# Patient Record
Sex: Male | Born: 1960 | Race: Black or African American | Hispanic: No | Marital: Single | Smoking: Former smoker
Health system: Southern US, Community
[De-identification: ages and names within clinical notes are randomized; demographics above are authoritative.]

## PROBLEM LIST (undated history)

## (undated) DIAGNOSIS — E079 Disorder of thyroid, unspecified: Secondary | ICD-10-CM

## (undated) DIAGNOSIS — N289 Disorder of kidney and ureter, unspecified: Secondary | ICD-10-CM

## (undated) DIAGNOSIS — M109 Gout, unspecified: Secondary | ICD-10-CM

## (undated) DIAGNOSIS — I10 Essential (primary) hypertension: Secondary | ICD-10-CM

## (undated) HISTORY — PX: HERNIA REPAIR: SHX51

---

## 2015-11-03 DIAGNOSIS — E785 Hyperlipidemia, unspecified: Secondary | ICD-10-CM | POA: Diagnosis not present

## 2015-11-03 DIAGNOSIS — I129 Hypertensive chronic kidney disease with stage 1 through stage 4 chronic kidney disease, or unspecified chronic kidney disease: Secondary | ICD-10-CM | POA: Diagnosis not present

## 2015-11-03 DIAGNOSIS — R809 Proteinuria, unspecified: Secondary | ICD-10-CM | POA: Diagnosis not present

## 2015-11-03 DIAGNOSIS — N179 Acute kidney failure, unspecified: Secondary | ICD-10-CM | POA: Diagnosis not present

## 2015-11-03 DIAGNOSIS — E039 Hypothyroidism, unspecified: Secondary | ICD-10-CM | POA: Diagnosis not present

## 2015-11-03 DIAGNOSIS — E790 Hyperuricemia without signs of inflammatory arthritis and tophaceous disease: Secondary | ICD-10-CM | POA: Diagnosis not present

## 2015-11-03 DIAGNOSIS — Z886 Allergy status to analgesic agent status: Secondary | ICD-10-CM | POA: Diagnosis not present

## 2015-11-03 DIAGNOSIS — N183 Chronic kidney disease, stage 3 (moderate): Secondary | ICD-10-CM | POA: Diagnosis not present

## 2015-11-03 DIAGNOSIS — Z79899 Other long term (current) drug therapy: Secondary | ICD-10-CM | POA: Diagnosis not present

## 2015-11-03 DIAGNOSIS — Z888 Allergy status to other drugs, medicaments and biological substances status: Secondary | ICD-10-CM | POA: Diagnosis not present

## 2015-12-08 DIAGNOSIS — I129 Hypertensive chronic kidney disease with stage 1 through stage 4 chronic kidney disease, or unspecified chronic kidney disease: Secondary | ICD-10-CM | POA: Diagnosis not present

## 2015-12-08 DIAGNOSIS — E559 Vitamin D deficiency, unspecified: Secondary | ICD-10-CM | POA: Diagnosis not present

## 2015-12-08 DIAGNOSIS — Z888 Allergy status to other drugs, medicaments and biological substances status: Secondary | ICD-10-CM | POA: Diagnosis not present

## 2015-12-08 DIAGNOSIS — R809 Proteinuria, unspecified: Secondary | ICD-10-CM | POA: Diagnosis not present

## 2015-12-08 DIAGNOSIS — N183 Chronic kidney disease, stage 3 (moderate): Secondary | ICD-10-CM | POA: Diagnosis not present

## 2015-12-08 DIAGNOSIS — N2581 Secondary hyperparathyroidism of renal origin: Secondary | ICD-10-CM | POA: Diagnosis not present

## 2015-12-08 DIAGNOSIS — E790 Hyperuricemia without signs of inflammatory arthritis and tophaceous disease: Secondary | ICD-10-CM | POA: Diagnosis not present

## 2015-12-08 DIAGNOSIS — E039 Hypothyroidism, unspecified: Secondary | ICD-10-CM | POA: Diagnosis not present

## 2015-12-08 DIAGNOSIS — Z79899 Other long term (current) drug therapy: Secondary | ICD-10-CM | POA: Diagnosis not present

## 2015-12-08 DIAGNOSIS — Z885 Allergy status to narcotic agent status: Secondary | ICD-10-CM | POA: Diagnosis not present

## 2015-12-08 DIAGNOSIS — E785 Hyperlipidemia, unspecified: Secondary | ICD-10-CM | POA: Diagnosis not present

## 2015-12-08 DIAGNOSIS — N179 Acute kidney failure, unspecified: Secondary | ICD-10-CM | POA: Diagnosis not present

## 2015-12-11 DIAGNOSIS — E785 Hyperlipidemia, unspecified: Secondary | ICD-10-CM | POA: Diagnosis not present

## 2015-12-11 DIAGNOSIS — K439 Ventral hernia without obstruction or gangrene: Secondary | ICD-10-CM | POA: Diagnosis not present

## 2015-12-11 DIAGNOSIS — N2581 Secondary hyperparathyroidism of renal origin: Secondary | ICD-10-CM | POA: Diagnosis not present

## 2015-12-11 DIAGNOSIS — Z23 Encounter for immunization: Secondary | ICD-10-CM | POA: Diagnosis not present

## 2015-12-11 DIAGNOSIS — E890 Postprocedural hypothyroidism: Secondary | ICD-10-CM | POA: Diagnosis not present

## 2015-12-11 DIAGNOSIS — I129 Hypertensive chronic kidney disease with stage 1 through stage 4 chronic kidney disease, or unspecified chronic kidney disease: Secondary | ICD-10-CM | POA: Diagnosis not present

## 2015-12-11 DIAGNOSIS — Z888 Allergy status to other drugs, medicaments and biological substances status: Secondary | ICD-10-CM | POA: Diagnosis not present

## 2015-12-11 DIAGNOSIS — Z79899 Other long term (current) drug therapy: Secondary | ICD-10-CM | POA: Diagnosis not present

## 2015-12-11 DIAGNOSIS — N183 Chronic kidney disease, stage 3 (moderate): Secondary | ICD-10-CM | POA: Diagnosis not present

## 2015-12-11 DIAGNOSIS — R809 Proteinuria, unspecified: Secondary | ICD-10-CM | POA: Diagnosis not present

## 2015-12-11 DIAGNOSIS — N042 Nephrotic syndrome with diffuse membranous glomerulonephritis: Secondary | ICD-10-CM | POA: Diagnosis not present

## 2016-02-09 DIAGNOSIS — I129 Hypertensive chronic kidney disease with stage 1 through stage 4 chronic kidney disease, or unspecified chronic kidney disease: Secondary | ICD-10-CM | POA: Diagnosis not present

## 2016-02-09 DIAGNOSIS — E039 Hypothyroidism, unspecified: Secondary | ICD-10-CM | POA: Diagnosis not present

## 2016-02-09 DIAGNOSIS — M79672 Pain in left foot: Secondary | ICD-10-CM | POA: Diagnosis not present

## 2016-02-09 DIAGNOSIS — Z87891 Personal history of nicotine dependence: Secondary | ICD-10-CM | POA: Diagnosis not present

## 2016-02-09 DIAGNOSIS — M7732 Calcaneal spur, left foot: Secondary | ICD-10-CM | POA: Diagnosis not present

## 2016-02-09 DIAGNOSIS — M25471 Effusion, right ankle: Secondary | ICD-10-CM | POA: Diagnosis not present

## 2016-02-09 DIAGNOSIS — M85872 Other specified disorders of bone density and structure, left ankle and foot: Secondary | ICD-10-CM | POA: Diagnosis not present

## 2016-02-09 DIAGNOSIS — E785 Hyperlipidemia, unspecified: Secondary | ICD-10-CM | POA: Diagnosis not present

## 2016-02-09 DIAGNOSIS — L03116 Cellulitis of left lower limb: Secondary | ICD-10-CM | POA: Diagnosis not present

## 2016-02-09 DIAGNOSIS — N183 Chronic kidney disease, stage 3 (moderate): Secondary | ICD-10-CM | POA: Diagnosis not present

## 2016-02-10 DIAGNOSIS — E785 Hyperlipidemia, unspecified: Secondary | ICD-10-CM | POA: Diagnosis not present

## 2016-02-10 DIAGNOSIS — Z886 Allergy status to analgesic agent status: Secondary | ICD-10-CM | POA: Diagnosis not present

## 2016-02-10 DIAGNOSIS — E0500 Thyrotoxicosis with diffuse goiter without thyrotoxic crisis or storm: Secondary | ICD-10-CM | POA: Diagnosis not present

## 2016-02-10 DIAGNOSIS — Z79899 Other long term (current) drug therapy: Secondary | ICD-10-CM | POA: Diagnosis not present

## 2016-02-10 DIAGNOSIS — I10 Essential (primary) hypertension: Secondary | ICD-10-CM | POA: Diagnosis not present

## 2016-02-10 DIAGNOSIS — M109 Gout, unspecified: Secondary | ICD-10-CM | POA: Diagnosis not present

## 2016-02-10 DIAGNOSIS — Z888 Allergy status to other drugs, medicaments and biological substances status: Secondary | ICD-10-CM | POA: Diagnosis not present

## 2016-02-10 DIAGNOSIS — Z87891 Personal history of nicotine dependence: Secondary | ICD-10-CM | POA: Diagnosis not present

## 2016-02-10 DIAGNOSIS — L03116 Cellulitis of left lower limb: Secondary | ICD-10-CM | POA: Diagnosis not present

## 2016-02-10 DIAGNOSIS — R600 Localized edema: Secondary | ICD-10-CM | POA: Diagnosis not present

## 2016-02-10 DIAGNOSIS — N183 Chronic kidney disease, stage 3 (moderate): Secondary | ICD-10-CM | POA: Diagnosis not present

## 2016-02-10 DIAGNOSIS — I517 Cardiomegaly: Secondary | ICD-10-CM | POA: Diagnosis not present

## 2016-02-10 DIAGNOSIS — Z5189 Encounter for other specified aftercare: Secondary | ICD-10-CM | POA: Diagnosis not present

## 2016-02-10 DIAGNOSIS — I129 Hypertensive chronic kidney disease with stage 1 through stage 4 chronic kidney disease, or unspecified chronic kidney disease: Secondary | ICD-10-CM | POA: Diagnosis not present

## 2016-03-08 DIAGNOSIS — E559 Vitamin D deficiency, unspecified: Secondary | ICD-10-CM | POA: Diagnosis not present

## 2016-03-08 DIAGNOSIS — Z79899 Other long term (current) drug therapy: Secondary | ICD-10-CM | POA: Diagnosis not present

## 2016-03-08 DIAGNOSIS — N183 Chronic kidney disease, stage 3 (moderate): Secondary | ICD-10-CM | POA: Diagnosis not present

## 2016-03-08 DIAGNOSIS — Z886 Allergy status to analgesic agent status: Secondary | ICD-10-CM | POA: Diagnosis not present

## 2016-03-08 DIAGNOSIS — E785 Hyperlipidemia, unspecified: Secondary | ICD-10-CM | POA: Diagnosis not present

## 2016-03-08 DIAGNOSIS — Z888 Allergy status to other drugs, medicaments and biological substances status: Secondary | ICD-10-CM | POA: Diagnosis not present

## 2016-03-08 DIAGNOSIS — E790 Hyperuricemia without signs of inflammatory arthritis and tophaceous disease: Secondary | ICD-10-CM | POA: Diagnosis not present

## 2016-03-08 DIAGNOSIS — R809 Proteinuria, unspecified: Secondary | ICD-10-CM | POA: Diagnosis not present

## 2016-03-08 DIAGNOSIS — E039 Hypothyroidism, unspecified: Secondary | ICD-10-CM | POA: Diagnosis not present

## 2016-03-08 DIAGNOSIS — N2581 Secondary hyperparathyroidism of renal origin: Secondary | ICD-10-CM | POA: Diagnosis not present

## 2016-03-08 DIAGNOSIS — I129 Hypertensive chronic kidney disease with stage 1 through stage 4 chronic kidney disease, or unspecified chronic kidney disease: Secondary | ICD-10-CM | POA: Diagnosis not present

## 2016-03-08 DIAGNOSIS — I1310 Hypertensive heart and chronic kidney disease without heart failure, with stage 1 through stage 4 chronic kidney disease, or unspecified chronic kidney disease: Secondary | ICD-10-CM | POA: Diagnosis not present

## 2016-04-01 DIAGNOSIS — E0500 Thyrotoxicosis with diffuse goiter without thyrotoxic crisis or storm: Secondary | ICD-10-CM | POA: Diagnosis not present

## 2016-04-01 DIAGNOSIS — E790 Hyperuricemia without signs of inflammatory arthritis and tophaceous disease: Secondary | ICD-10-CM | POA: Diagnosis not present

## 2016-04-01 DIAGNOSIS — K432 Incisional hernia without obstruction or gangrene: Secondary | ICD-10-CM | POA: Diagnosis not present

## 2016-04-01 DIAGNOSIS — Z79899 Other long term (current) drug therapy: Secondary | ICD-10-CM | POA: Diagnosis not present

## 2016-04-01 DIAGNOSIS — E039 Hypothyroidism, unspecified: Secondary | ICD-10-CM | POA: Diagnosis not present

## 2016-04-01 DIAGNOSIS — N183 Chronic kidney disease, stage 3 (moderate): Secondary | ICD-10-CM | POA: Diagnosis not present

## 2016-04-01 DIAGNOSIS — Z23 Encounter for immunization: Secondary | ICD-10-CM | POA: Diagnosis not present

## 2016-04-01 DIAGNOSIS — I129 Hypertensive chronic kidney disease with stage 1 through stage 4 chronic kidney disease, or unspecified chronic kidney disease: Secondary | ICD-10-CM | POA: Diagnosis not present

## 2016-04-01 DIAGNOSIS — N049 Nephrotic syndrome with unspecified morphologic changes: Secondary | ICD-10-CM | POA: Diagnosis not present

## 2016-05-08 ENCOUNTER — Emergency Department (HOSPITAL_BASED_OUTPATIENT_CLINIC_OR_DEPARTMENT_OTHER)
Admission: EM | Admit: 2016-05-08 | Discharge: 2016-05-08 | Disposition: A | Discharge status: Home / Self Care | Payer: Self-pay | Attending: Emergency Medicine | Admitting: Emergency Medicine

## 2016-05-08 ENCOUNTER — Emergency Department (HOSPITAL_BASED_OUTPATIENT_CLINIC_OR_DEPARTMENT_OTHER): Payer: Self-pay

## 2016-05-08 ENCOUNTER — Encounter (HOSPITAL_BASED_OUTPATIENT_CLINIC_OR_DEPARTMENT_OTHER): Payer: Self-pay | Admitting: *Deleted

## 2016-05-08 DIAGNOSIS — I10 Essential (primary) hypertension: Secondary | ICD-10-CM | POA: Insufficient documentation

## 2016-05-08 DIAGNOSIS — Y999 Unspecified external cause status: Secondary | ICD-10-CM | POA: Insufficient documentation

## 2016-05-08 DIAGNOSIS — Z87891 Personal history of nicotine dependence: Secondary | ICD-10-CM | POA: Insufficient documentation

## 2016-05-08 DIAGNOSIS — R55 Syncope and collapse: Secondary | ICD-10-CM | POA: Insufficient documentation

## 2016-05-08 DIAGNOSIS — Y92411 Interstate highway as the place of occurrence of the external cause: Secondary | ICD-10-CM | POA: Insufficient documentation

## 2016-05-08 DIAGNOSIS — S39012A Strain of muscle, fascia and tendon of lower back, initial encounter: Secondary | ICD-10-CM | POA: Insufficient documentation

## 2016-05-08 DIAGNOSIS — S60212A Contusion of left wrist, initial encounter: Secondary | ICD-10-CM | POA: Insufficient documentation

## 2016-05-08 DIAGNOSIS — Y9389 Activity, other specified: Secondary | ICD-10-CM | POA: Insufficient documentation

## 2016-05-08 DIAGNOSIS — Z79899 Other long term (current) drug therapy: Secondary | ICD-10-CM | POA: Insufficient documentation

## 2016-05-08 DIAGNOSIS — S8012XA Contusion of left lower leg, initial encounter: Secondary | ICD-10-CM | POA: Insufficient documentation

## 2016-05-08 HISTORY — DX: Disorder of kidney and ureter, unspecified: N28.9

## 2016-05-08 HISTORY — DX: Essential (primary) hypertension: I10

## 2016-05-08 HISTORY — DX: Disorder of thyroid, unspecified: E07.9
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
## 2016-06-07 DIAGNOSIS — I129 Hypertensive chronic kidney disease with stage 1 through stage 4 chronic kidney disease, or unspecified chronic kidney disease: Secondary | ICD-10-CM | POA: Diagnosis not present

## 2016-06-07 DIAGNOSIS — N059 Unspecified nephritic syndrome with unspecified morphologic changes: Secondary | ICD-10-CM | POA: Diagnosis not present

## 2016-06-07 DIAGNOSIS — E559 Vitamin D deficiency, unspecified: Secondary | ICD-10-CM | POA: Diagnosis not present

## 2016-06-07 DIAGNOSIS — E790 Hyperuricemia without signs of inflammatory arthritis and tophaceous disease: Secondary | ICD-10-CM | POA: Diagnosis not present

## 2016-06-07 DIAGNOSIS — R801 Persistent proteinuria, unspecified: Secondary | ICD-10-CM | POA: Diagnosis not present

## 2016-06-07 DIAGNOSIS — N183 Chronic kidney disease, stage 3 (moderate): Secondary | ICD-10-CM | POA: Diagnosis not present

## 2016-06-07 DIAGNOSIS — N2581 Secondary hyperparathyroidism of renal origin: Secondary | ICD-10-CM | POA: Diagnosis not present

## 2016-06-07 DIAGNOSIS — Z79899 Other long term (current) drug therapy: Secondary | ICD-10-CM | POA: Diagnosis not present

## 2016-06-07 DIAGNOSIS — Z7982 Long term (current) use of aspirin: Secondary | ICD-10-CM | POA: Diagnosis not present

## 2016-06-07 DIAGNOSIS — R809 Proteinuria, unspecified: Secondary | ICD-10-CM | POA: Diagnosis not present

## 2016-06-07 DIAGNOSIS — E785 Hyperlipidemia, unspecified: Secondary | ICD-10-CM | POA: Diagnosis not present

## 2016-06-07 DIAGNOSIS — Z888 Allergy status to other drugs, medicaments and biological substances status: Secondary | ICD-10-CM | POA: Diagnosis not present

## 2016-06-07 DIAGNOSIS — M109 Gout, unspecified: Secondary | ICD-10-CM | POA: Diagnosis not present

## 2016-06-07 DIAGNOSIS — E039 Hypothyroidism, unspecified: Secondary | ICD-10-CM | POA: Diagnosis not present

## 2016-09-07 DIAGNOSIS — E039 Hypothyroidism, unspecified: Secondary | ICD-10-CM | POA: Diagnosis not present

## 2016-09-07 DIAGNOSIS — E785 Hyperlipidemia, unspecified: Secondary | ICD-10-CM | POA: Diagnosis not present

## 2016-09-07 DIAGNOSIS — Z79899 Other long term (current) drug therapy: Secondary | ICD-10-CM | POA: Diagnosis not present

## 2016-09-07 DIAGNOSIS — N183 Chronic kidney disease, stage 3 (moderate): Secondary | ICD-10-CM | POA: Diagnosis not present

## 2016-09-07 DIAGNOSIS — N2581 Secondary hyperparathyroidism of renal origin: Secondary | ICD-10-CM | POA: Diagnosis not present

## 2016-09-07 DIAGNOSIS — Z87891 Personal history of nicotine dependence: Secondary | ICD-10-CM | POA: Diagnosis not present

## 2016-09-07 DIAGNOSIS — I129 Hypertensive chronic kidney disease with stage 1 through stage 4 chronic kidney disease, or unspecified chronic kidney disease: Secondary | ICD-10-CM | POA: Diagnosis not present

## 2016-09-07 DIAGNOSIS — R801 Persistent proteinuria, unspecified: Secondary | ICD-10-CM | POA: Diagnosis not present

## 2016-09-07 DIAGNOSIS — M109 Gout, unspecified: Secondary | ICD-10-CM | POA: Diagnosis not present

## 2016-09-07 DIAGNOSIS — Z888 Allergy status to other drugs, medicaments and biological substances status: Secondary | ICD-10-CM | POA: Diagnosis not present

## 2016-09-07 DIAGNOSIS — R809 Proteinuria, unspecified: Secondary | ICD-10-CM | POA: Diagnosis not present

## 2016-09-07 DIAGNOSIS — Z7982 Long term (current) use of aspirin: Secondary | ICD-10-CM | POA: Diagnosis not present

## 2016-09-07 DIAGNOSIS — E559 Vitamin D deficiency, unspecified: Secondary | ICD-10-CM | POA: Diagnosis not present

## 2016-11-04 DIAGNOSIS — Z79899 Other long term (current) drug therapy: Secondary | ICD-10-CM | POA: Diagnosis not present

## 2016-11-04 DIAGNOSIS — K439 Ventral hernia without obstruction or gangrene: Secondary | ICD-10-CM | POA: Diagnosis not present

## 2016-11-04 DIAGNOSIS — I129 Hypertensive chronic kidney disease with stage 1 through stage 4 chronic kidney disease, or unspecified chronic kidney disease: Secondary | ICD-10-CM | POA: Diagnosis not present

## 2016-11-04 DIAGNOSIS — Z125 Encounter for screening for malignant neoplasm of prostate: Secondary | ICD-10-CM | POA: Diagnosis not present

## 2016-11-04 DIAGNOSIS — Z23 Encounter for immunization: Secondary | ICD-10-CM | POA: Diagnosis not present

## 2016-11-04 DIAGNOSIS — N042 Nephrotic syndrome with diffuse membranous glomerulonephritis: Secondary | ICD-10-CM | POA: Diagnosis not present

## 2016-11-04 DIAGNOSIS — Z7982 Long term (current) use of aspirin: Secondary | ICD-10-CM | POA: Diagnosis not present

## 2016-11-04 DIAGNOSIS — Z0000 Encounter for general adult medical examination without abnormal findings: Secondary | ICD-10-CM | POA: Diagnosis not present

## 2016-11-04 DIAGNOSIS — R809 Proteinuria, unspecified: Secondary | ICD-10-CM | POA: Diagnosis not present

## 2016-11-04 DIAGNOSIS — N2581 Secondary hyperparathyroidism of renal origin: Secondary | ICD-10-CM | POA: Diagnosis not present

## 2016-11-04 DIAGNOSIS — Z888 Allergy status to other drugs, medicaments and biological substances status: Secondary | ICD-10-CM | POA: Diagnosis not present

## 2016-11-04 DIAGNOSIS — E0500 Thyrotoxicosis with diffuse goiter without thyrotoxic crisis or storm: Secondary | ICD-10-CM | POA: Diagnosis not present

## 2016-11-04 DIAGNOSIS — N183 Chronic kidney disease, stage 3 (moderate): Secondary | ICD-10-CM | POA: Diagnosis not present

## 2016-11-04 DIAGNOSIS — Z9889 Other specified postprocedural states: Secondary | ICD-10-CM | POA: Diagnosis not present

## 2016-11-04 DIAGNOSIS — E039 Hypothyroidism, unspecified: Secondary | ICD-10-CM | POA: Diagnosis not present

## 2016-12-14 DIAGNOSIS — R809 Proteinuria, unspecified: Secondary | ICD-10-CM | POA: Diagnosis not present

## 2016-12-14 DIAGNOSIS — E039 Hypothyroidism, unspecified: Secondary | ICD-10-CM | POA: Diagnosis not present

## 2016-12-14 DIAGNOSIS — M109 Gout, unspecified: Secondary | ICD-10-CM | POA: Diagnosis not present

## 2016-12-14 DIAGNOSIS — R801 Persistent proteinuria, unspecified: Secondary | ICD-10-CM | POA: Diagnosis not present

## 2016-12-14 DIAGNOSIS — I129 Hypertensive chronic kidney disease with stage 1 through stage 4 chronic kidney disease, or unspecified chronic kidney disease: Secondary | ICD-10-CM | POA: Diagnosis not present

## 2016-12-14 DIAGNOSIS — N183 Chronic kidney disease, stage 3 (moderate): Secondary | ICD-10-CM | POA: Diagnosis not present

## 2016-12-14 DIAGNOSIS — I10 Essential (primary) hypertension: Secondary | ICD-10-CM | POA: Diagnosis not present

## 2016-12-14 DIAGNOSIS — E785 Hyperlipidemia, unspecified: Secondary | ICD-10-CM | POA: Diagnosis not present

## 2016-12-14 DIAGNOSIS — Z1159 Encounter for screening for other viral diseases: Secondary | ICD-10-CM | POA: Diagnosis not present

## 2016-12-14 DIAGNOSIS — N2581 Secondary hyperparathyroidism of renal origin: Secondary | ICD-10-CM | POA: Diagnosis not present

## 2017-02-10 DIAGNOSIS — I129 Hypertensive chronic kidney disease with stage 1 through stage 4 chronic kidney disease, or unspecified chronic kidney disease: Secondary | ICD-10-CM | POA: Diagnosis not present

## 2017-02-10 DIAGNOSIS — N183 Chronic kidney disease, stage 3 (moderate): Secondary | ICD-10-CM | POA: Diagnosis not present

## 2017-02-10 DIAGNOSIS — E790 Hyperuricemia without signs of inflammatory arthritis and tophaceous disease: Secondary | ICD-10-CM | POA: Diagnosis not present

## 2017-02-10 DIAGNOSIS — N2581 Secondary hyperparathyroidism of renal origin: Secondary | ICD-10-CM | POA: Diagnosis not present

## 2017-02-10 DIAGNOSIS — E559 Vitamin D deficiency, unspecified: Secondary | ICD-10-CM | POA: Diagnosis not present

## 2017-02-10 DIAGNOSIS — N052 Unspecified nephritic syndrome with diffuse membranous glomerulonephritis: Secondary | ICD-10-CM | POA: Diagnosis not present

## 2017-02-10 DIAGNOSIS — Z7951 Long term (current) use of inhaled steroids: Secondary | ICD-10-CM | POA: Diagnosis not present

## 2017-02-10 DIAGNOSIS — Z7982 Long term (current) use of aspirin: Secondary | ICD-10-CM | POA: Diagnosis not present

## 2017-03-16 DIAGNOSIS — E785 Hyperlipidemia, unspecified: Secondary | ICD-10-CM | POA: Diagnosis not present

## 2017-03-16 DIAGNOSIS — R809 Proteinuria, unspecified: Secondary | ICD-10-CM | POA: Diagnosis not present

## 2017-03-16 DIAGNOSIS — N2581 Secondary hyperparathyroidism of renal origin: Secondary | ICD-10-CM | POA: Diagnosis not present

## 2017-03-16 DIAGNOSIS — R801 Persistent proteinuria, unspecified: Secondary | ICD-10-CM | POA: Diagnosis not present

## 2017-03-16 DIAGNOSIS — N184 Chronic kidney disease, stage 4 (severe): Secondary | ICD-10-CM | POA: Diagnosis not present

## 2017-03-16 DIAGNOSIS — M109 Gout, unspecified: Secondary | ICD-10-CM | POA: Diagnosis not present

## 2017-03-16 DIAGNOSIS — I129 Hypertensive chronic kidney disease with stage 1 through stage 4 chronic kidney disease, or unspecified chronic kidney disease: Secondary | ICD-10-CM | POA: Diagnosis not present

## 2017-03-16 DIAGNOSIS — N183 Chronic kidney disease, stage 3 (moderate): Secondary | ICD-10-CM | POA: Diagnosis not present

## 2017-03-16 DIAGNOSIS — E039 Hypothyroidism, unspecified: Secondary | ICD-10-CM | POA: Diagnosis not present

## 2017-04-04 DIAGNOSIS — M109 Gout, unspecified: Secondary | ICD-10-CM | POA: Diagnosis not present

## 2017-04-21 DIAGNOSIS — M1A30X0 Chronic gout due to renal impairment, unspecified site, without tophus (tophi): Secondary | ICD-10-CM | POA: Diagnosis not present

## 2017-04-21 DIAGNOSIS — M109 Gout, unspecified: Secondary | ICD-10-CM | POA: Diagnosis not present

## 2017-04-21 DIAGNOSIS — N183 Chronic kidney disease, stage 3 (moderate): Secondary | ICD-10-CM | POA: Diagnosis not present

## 2017-04-21 DIAGNOSIS — Z125 Encounter for screening for malignant neoplasm of prostate: Secondary | ICD-10-CM | POA: Diagnosis not present

## 2017-04-21 DIAGNOSIS — E039 Hypothyroidism, unspecified: Secondary | ICD-10-CM | POA: Diagnosis not present

## 2017-04-21 DIAGNOSIS — K439 Ventral hernia without obstruction or gangrene: Secondary | ICD-10-CM | POA: Diagnosis not present

## 2017-04-21 DIAGNOSIS — I129 Hypertensive chronic kidney disease with stage 1 through stage 4 chronic kidney disease, or unspecified chronic kidney disease: Secondary | ICD-10-CM | POA: Diagnosis not present

## 2017-04-21 DIAGNOSIS — R801 Persistent proteinuria, unspecified: Secondary | ICD-10-CM | POA: Diagnosis not present

## 2017-06-17 DIAGNOSIS — R801 Persistent proteinuria, unspecified: Secondary | ICD-10-CM | POA: Diagnosis not present

## 2017-06-17 DIAGNOSIS — N2581 Secondary hyperparathyroidism of renal origin: Secondary | ICD-10-CM | POA: Diagnosis not present

## 2017-06-17 DIAGNOSIS — Z79899 Other long term (current) drug therapy: Secondary | ICD-10-CM | POA: Diagnosis not present

## 2017-06-17 DIAGNOSIS — E785 Hyperlipidemia, unspecified: Secondary | ICD-10-CM | POA: Diagnosis not present

## 2017-06-17 DIAGNOSIS — E039 Hypothyroidism, unspecified: Secondary | ICD-10-CM | POA: Diagnosis not present

## 2017-06-17 DIAGNOSIS — E559 Vitamin D deficiency, unspecified: Secondary | ICD-10-CM | POA: Diagnosis not present

## 2017-06-17 DIAGNOSIS — N184 Chronic kidney disease, stage 4 (severe): Secondary | ICD-10-CM | POA: Diagnosis not present

## 2017-06-17 DIAGNOSIS — I129 Hypertensive chronic kidney disease with stage 1 through stage 4 chronic kidney disease, or unspecified chronic kidney disease: Secondary | ICD-10-CM | POA: Diagnosis not present

## 2017-06-17 DIAGNOSIS — M109 Gout, unspecified: Secondary | ICD-10-CM | POA: Diagnosis not present

## 2017-06-17 DIAGNOSIS — Z888 Allergy status to other drugs, medicaments and biological substances status: Secondary | ICD-10-CM | POA: Diagnosis not present

## 2017-08-01 DIAGNOSIS — K439 Ventral hernia without obstruction or gangrene: Secondary | ICD-10-CM | POA: Diagnosis not present

## 2017-08-01 DIAGNOSIS — R0609 Other forms of dyspnea: Secondary | ICD-10-CM | POA: Diagnosis not present

## 2017-08-01 DIAGNOSIS — E785 Hyperlipidemia, unspecified: Secondary | ICD-10-CM | POA: Diagnosis not present

## 2017-08-01 DIAGNOSIS — J45909 Unspecified asthma, uncomplicated: Secondary | ICD-10-CM | POA: Diagnosis not present

## 2017-08-01 DIAGNOSIS — R809 Proteinuria, unspecified: Secondary | ICD-10-CM | POA: Diagnosis not present

## 2017-08-01 DIAGNOSIS — N184 Chronic kidney disease, stage 4 (severe): Secondary | ICD-10-CM | POA: Diagnosis not present

## 2017-08-01 DIAGNOSIS — M109 Gout, unspecified: Secondary | ICD-10-CM | POA: Diagnosis not present

## 2017-08-01 DIAGNOSIS — N183 Chronic kidney disease, stage 3 (moderate): Secondary | ICD-10-CM | POA: Diagnosis not present

## 2017-08-01 DIAGNOSIS — I129 Hypertensive chronic kidney disease with stage 1 through stage 4 chronic kidney disease, or unspecified chronic kidney disease: Secondary | ICD-10-CM | POA: Diagnosis not present

## 2017-08-01 DIAGNOSIS — E890 Postprocedural hypothyroidism: Secondary | ICD-10-CM | POA: Diagnosis not present

## 2017-08-01 DIAGNOSIS — N052 Unspecified nephritic syndrome with diffuse membranous glomerulonephritis: Secondary | ICD-10-CM | POA: Diagnosis not present

## 2017-08-12 DIAGNOSIS — K760 Fatty (change of) liver, not elsewhere classified: Secondary | ICD-10-CM | POA: Diagnosis not present

## 2017-09-22 DIAGNOSIS — S6990XA Unspecified injury of unspecified wrist, hand and finger(s), initial encounter: Secondary | ICD-10-CM | POA: Diagnosis not present

## 2017-09-29 DIAGNOSIS — S6990XA Unspecified injury of unspecified wrist, hand and finger(s), initial encounter: Secondary | ICD-10-CM | POA: Diagnosis not present

## 2017-09-30 DIAGNOSIS — M79671 Pain in right foot: Secondary | ICD-10-CM | POA: Diagnosis not present

## 2017-09-30 DIAGNOSIS — L84 Corns and callosities: Secondary | ICD-10-CM | POA: Diagnosis not present

## 2017-09-30 DIAGNOSIS — M2040 Other hammer toe(s) (acquired), unspecified foot: Secondary | ICD-10-CM | POA: Diagnosis not present

## 2017-09-30 DIAGNOSIS — M79672 Pain in left foot: Secondary | ICD-10-CM | POA: Diagnosis not present

## 2017-10-12 DIAGNOSIS — I129 Hypertensive chronic kidney disease with stage 1 through stage 4 chronic kidney disease, or unspecified chronic kidney disease: Secondary | ICD-10-CM | POA: Diagnosis not present

## 2017-10-12 DIAGNOSIS — R809 Proteinuria, unspecified: Secondary | ICD-10-CM | POA: Diagnosis not present

## 2017-10-12 DIAGNOSIS — E785 Hyperlipidemia, unspecified: Secondary | ICD-10-CM | POA: Diagnosis not present

## 2017-10-12 DIAGNOSIS — Z888 Allergy status to other drugs, medicaments and biological substances status: Secondary | ICD-10-CM | POA: Diagnosis not present

## 2017-10-12 DIAGNOSIS — N2581 Secondary hyperparathyroidism of renal origin: Secondary | ICD-10-CM | POA: Diagnosis not present

## 2017-10-12 DIAGNOSIS — E039 Hypothyroidism, unspecified: Secondary | ICD-10-CM | POA: Diagnosis not present

## 2017-10-12 DIAGNOSIS — M109 Gout, unspecified: Secondary | ICD-10-CM | POA: Diagnosis not present

## 2017-10-12 DIAGNOSIS — N184 Chronic kidney disease, stage 4 (severe): Secondary | ICD-10-CM | POA: Diagnosis not present

## 2017-10-12 DIAGNOSIS — Z79899 Other long term (current) drug therapy: Secondary | ICD-10-CM | POA: Diagnosis not present

## 2017-10-12 DIAGNOSIS — R801 Persistent proteinuria, unspecified: Secondary | ICD-10-CM | POA: Diagnosis not present

## 2017-10-12 DIAGNOSIS — E559 Vitamin D deficiency, unspecified: Secondary | ICD-10-CM | POA: Diagnosis not present

## 2017-11-01 DIAGNOSIS — Z87828 Personal history of other (healed) physical injury and trauma: Secondary | ICD-10-CM | POA: Diagnosis not present

## 2017-11-01 DIAGNOSIS — N183 Chronic kidney disease, stage 3 (moderate): Secondary | ICD-10-CM | POA: Diagnosis not present

## 2017-11-01 DIAGNOSIS — E0500 Thyrotoxicosis with diffuse goiter without thyrotoxic crisis or storm: Secondary | ICD-10-CM | POA: Diagnosis not present

## 2017-11-01 DIAGNOSIS — M25512 Pain in left shoulder: Secondary | ICD-10-CM | POA: Diagnosis not present

## 2017-11-01 DIAGNOSIS — M79622 Pain in left upper arm: Secondary | ICD-10-CM | POA: Diagnosis not present

## 2017-11-01 DIAGNOSIS — D72819 Decreased white blood cell count, unspecified: Secondary | ICD-10-CM | POA: Diagnosis not present

## 2017-11-01 DIAGNOSIS — R0602 Shortness of breath: Secondary | ICD-10-CM | POA: Diagnosis not present

## 2017-11-01 DIAGNOSIS — Z79899 Other long term (current) drug therapy: Secondary | ICD-10-CM | POA: Diagnosis not present

## 2017-11-01 DIAGNOSIS — M79602 Pain in left arm: Secondary | ICD-10-CM | POA: Diagnosis not present

## 2017-11-01 DIAGNOSIS — R9431 Abnormal electrocardiogram [ECG] [EKG]: Secondary | ICD-10-CM | POA: Diagnosis not present

## 2017-11-01 DIAGNOSIS — I129 Hypertensive chronic kidney disease with stage 1 through stage 4 chronic kidney disease, or unspecified chronic kidney disease: Secondary | ICD-10-CM | POA: Diagnosis not present

## 2017-11-01 DIAGNOSIS — R600 Localized edema: Secondary | ICD-10-CM | POA: Diagnosis not present

## 2017-11-01 DIAGNOSIS — R072 Precordial pain: Secondary | ICD-10-CM | POA: Diagnosis not present

## 2017-11-02 DIAGNOSIS — R9431 Abnormal electrocardiogram [ECG] [EKG]: Secondary | ICD-10-CM | POA: Diagnosis not present

## 2017-11-02 DIAGNOSIS — I129 Hypertensive chronic kidney disease with stage 1 through stage 4 chronic kidney disease, or unspecified chronic kidney disease: Secondary | ICD-10-CM | POA: Diagnosis not present

## 2017-11-02 DIAGNOSIS — N183 Chronic kidney disease, stage 3 (moderate): Secondary | ICD-10-CM | POA: Diagnosis not present

## 2017-11-02 DIAGNOSIS — R001 Bradycardia, unspecified: Secondary | ICD-10-CM | POA: Diagnosis not present

## 2017-11-02 DIAGNOSIS — R072 Precordial pain: Secondary | ICD-10-CM | POA: Diagnosis not present

## 2017-11-18 DIAGNOSIS — K439 Ventral hernia without obstruction or gangrene: Secondary | ICD-10-CM | POA: Diagnosis not present

## 2017-11-18 DIAGNOSIS — E039 Hypothyroidism, unspecified: Secondary | ICD-10-CM | POA: Diagnosis not present

## 2017-11-18 DIAGNOSIS — M109 Gout, unspecified: Secondary | ICD-10-CM | POA: Diagnosis not present

## 2017-11-18 DIAGNOSIS — I129 Hypertensive chronic kidney disease with stage 1 through stage 4 chronic kidney disease, or unspecified chronic kidney disease: Secondary | ICD-10-CM | POA: Diagnosis not present

## 2017-11-18 DIAGNOSIS — R7303 Prediabetes: Secondary | ICD-10-CM | POA: Diagnosis not present

## 2017-11-18 DIAGNOSIS — N183 Chronic kidney disease, stage 3 (moderate): Secondary | ICD-10-CM | POA: Diagnosis not present

## 2017-11-18 DIAGNOSIS — N042 Nephrotic syndrome with diffuse membranous glomerulonephritis: Secondary | ICD-10-CM | POA: Diagnosis not present

## 2017-11-18 DIAGNOSIS — R0600 Dyspnea, unspecified: Secondary | ICD-10-CM | POA: Diagnosis not present

## 2017-11-18 DIAGNOSIS — Z2821 Immunization not carried out because of patient refusal: Secondary | ICD-10-CM | POA: Diagnosis not present

## 2017-11-18 DIAGNOSIS — Z8639 Personal history of other endocrine, nutritional and metabolic disease: Secondary | ICD-10-CM | POA: Diagnosis not present

## 2017-11-18 DIAGNOSIS — E785 Hyperlipidemia, unspecified: Secondary | ICD-10-CM | POA: Diagnosis not present

## 2017-11-18 DIAGNOSIS — N184 Chronic kidney disease, stage 4 (severe): Secondary | ICD-10-CM | POA: Diagnosis not present

## 2017-11-18 DIAGNOSIS — Z923 Personal history of irradiation: Secondary | ICD-10-CM | POA: Diagnosis not present

## 2017-11-18 DIAGNOSIS — Z87891 Personal history of nicotine dependence: Secondary | ICD-10-CM | POA: Diagnosis not present

## 2017-11-18 DIAGNOSIS — R0609 Other forms of dyspnea: Secondary | ICD-10-CM | POA: Diagnosis not present

## 2017-11-18 DIAGNOSIS — Z79899 Other long term (current) drug therapy: Secondary | ICD-10-CM | POA: Diagnosis not present

## 2017-12-09 ENCOUNTER — Other Ambulatory Visit: Payer: Self-pay

## 2017-12-09 NOTE — Patient Outreach (Signed)
[facility]) Care Management  12/09/2017  Hector York [birth year] [id number]   Medication Adherence call to Mrs. Hector York  patient's telephone number is disconnected patient is due on Atorvastatin 40 mg under [place].   [facility] Management Direct Dial [phone]  Fax [phone] [email or web address]

## 2018-03-10 DIAGNOSIS — R0609 Other forms of dyspnea: Secondary | ICD-10-CM | POA: Diagnosis not present

## 2018-03-10 DIAGNOSIS — Z87891 Personal history of nicotine dependence: Secondary | ICD-10-CM | POA: Diagnosis not present

## 2018-03-10 DIAGNOSIS — E039 Hypothyroidism, unspecified: Secondary | ICD-10-CM | POA: Diagnosis not present

## 2018-03-10 DIAGNOSIS — I129 Hypertensive chronic kidney disease with stage 1 through stage 4 chronic kidney disease, or unspecified chronic kidney disease: Secondary | ICD-10-CM | POA: Diagnosis not present

## 2018-03-10 DIAGNOSIS — E038 Other specified hypothyroidism: Secondary | ICD-10-CM | POA: Diagnosis not present

## 2018-03-10 DIAGNOSIS — Z79899 Other long term (current) drug therapy: Secondary | ICD-10-CM | POA: Diagnosis not present

## 2018-03-10 DIAGNOSIS — M109 Gout, unspecified: Secondary | ICD-10-CM | POA: Diagnosis not present

## 2018-03-10 DIAGNOSIS — N183 Chronic kidney disease, stage 3 (moderate): Secondary | ICD-10-CM | POA: Diagnosis not present

## 2018-03-10 DIAGNOSIS — N184 Chronic kidney disease, stage 4 (severe): Secondary | ICD-10-CM | POA: Diagnosis not present

## 2018-03-10 DIAGNOSIS — J988 Other specified respiratory disorders: Secondary | ICD-10-CM | POA: Diagnosis not present

## 2018-03-10 DIAGNOSIS — K432 Incisional hernia without obstruction or gangrene: Secondary | ICD-10-CM | POA: Diagnosis not present

## 2018-03-10 DIAGNOSIS — K7460 Unspecified cirrhosis of liver: Secondary | ICD-10-CM | POA: Diagnosis not present

## 2018-03-10 DIAGNOSIS — E0500 Thyrotoxicosis with diffuse goiter without thyrotoxic crisis or storm: Secondary | ICD-10-CM | POA: Diagnosis not present

## 2018-03-10 DIAGNOSIS — R0600 Dyspnea, unspecified: Secondary | ICD-10-CM | POA: Diagnosis not present

## 2018-03-10 DIAGNOSIS — E785 Hyperlipidemia, unspecified: Secondary | ICD-10-CM | POA: Diagnosis not present

## 2018-07-06 DIAGNOSIS — H02846 Edema of left eye, unspecified eyelid: Secondary | ICD-10-CM | POA: Diagnosis not present

## 2018-07-07 DIAGNOSIS — H5789 Other specified disorders of eye and adnexa: Secondary | ICD-10-CM | POA: Diagnosis not present

## 2018-07-31 DIAGNOSIS — N184 Chronic kidney disease, stage 4 (severe): Secondary | ICD-10-CM | POA: Diagnosis not present

## 2018-07-31 DIAGNOSIS — Z803 Family history of malignant neoplasm of breast: Secondary | ICD-10-CM | POA: Diagnosis not present

## 2018-07-31 DIAGNOSIS — Z87891 Personal history of nicotine dependence: Secondary | ICD-10-CM | POA: Diagnosis not present

## 2018-07-31 DIAGNOSIS — T783XXD Angioneurotic edema, subsequent encounter: Secondary | ICD-10-CM | POA: Diagnosis not present

## 2018-07-31 DIAGNOSIS — E785 Hyperlipidemia, unspecified: Secondary | ICD-10-CM | POA: Diagnosis not present

## 2018-07-31 DIAGNOSIS — D649 Anemia, unspecified: Secondary | ICD-10-CM | POA: Diagnosis not present

## 2018-07-31 DIAGNOSIS — Z79899 Other long term (current) drug therapy: Secondary | ICD-10-CM | POA: Diagnosis not present

## 2018-07-31 DIAGNOSIS — Z800 Family history of malignant neoplasm of digestive organs: Secondary | ICD-10-CM | POA: Diagnosis not present

## 2018-07-31 DIAGNOSIS — Z8619 Personal history of other infectious and parasitic diseases: Secondary | ICD-10-CM | POA: Diagnosis not present

## 2018-07-31 DIAGNOSIS — I129 Hypertensive chronic kidney disease with stage 1 through stage 4 chronic kidney disease, or unspecified chronic kidney disease: Secondary | ICD-10-CM | POA: Diagnosis not present

## 2018-07-31 DIAGNOSIS — K7460 Unspecified cirrhosis of liver: Secondary | ICD-10-CM | POA: Diagnosis not present

## 2018-07-31 DIAGNOSIS — K432 Incisional hernia without obstruction or gangrene: Secondary | ICD-10-CM | POA: Diagnosis not present

## 2018-07-31 DIAGNOSIS — E890 Postprocedural hypothyroidism: Secondary | ICD-10-CM | POA: Diagnosis not present

## 2018-07-31 DIAGNOSIS — M109 Gout, unspecified: Secondary | ICD-10-CM | POA: Diagnosis not present

## 2018-07-31 DIAGNOSIS — Z9889 Other specified postprocedural states: Secondary | ICD-10-CM | POA: Diagnosis not present

## 2018-08-14 DIAGNOSIS — E790 Hyperuricemia without signs of inflammatory arthritis and tophaceous disease: Secondary | ICD-10-CM | POA: Diagnosis not present

## 2018-08-14 DIAGNOSIS — N184 Chronic kidney disease, stage 4 (severe): Secondary | ICD-10-CM | POA: Diagnosis not present

## 2018-08-14 DIAGNOSIS — N2581 Secondary hyperparathyroidism of renal origin: Secondary | ICD-10-CM | POA: Diagnosis not present

## 2018-08-14 DIAGNOSIS — I129 Hypertensive chronic kidney disease with stage 1 through stage 4 chronic kidney disease, or unspecified chronic kidney disease: Secondary | ICD-10-CM | POA: Diagnosis not present

## 2018-08-14 DIAGNOSIS — E039 Hypothyroidism, unspecified: Secondary | ICD-10-CM | POA: Diagnosis not present

## 2018-08-14 DIAGNOSIS — M109 Gout, unspecified: Secondary | ICD-10-CM | POA: Diagnosis not present

## 2018-08-14 DIAGNOSIS — R801 Persistent proteinuria, unspecified: Secondary | ICD-10-CM | POA: Diagnosis not present

## 2018-08-14 DIAGNOSIS — E785 Hyperlipidemia, unspecified: Secondary | ICD-10-CM | POA: Diagnosis not present

## 2018-08-14 DIAGNOSIS — Z9229 Personal history of other drug therapy: Secondary | ICD-10-CM | POA: Diagnosis not present

## 2018-08-23 DIAGNOSIS — K439 Ventral hernia without obstruction or gangrene: Secondary | ICD-10-CM | POA: Diagnosis not present

## 2018-09-12 DIAGNOSIS — Z1159 Encounter for screening for other viral diseases: Secondary | ICD-10-CM | POA: Diagnosis not present

## 2018-09-12 DIAGNOSIS — Z8639 Personal history of other endocrine, nutritional and metabolic disease: Secondary | ICD-10-CM | POA: Diagnosis not present

## 2018-09-12 DIAGNOSIS — R339 Retention of urine, unspecified: Secondary | ICD-10-CM | POA: Diagnosis not present

## 2018-09-12 DIAGNOSIS — K432 Incisional hernia without obstruction or gangrene: Secondary | ICD-10-CM | POA: Diagnosis not present

## 2018-09-12 DIAGNOSIS — Z01812 Encounter for preprocedural laboratory examination: Secondary | ICD-10-CM | POA: Diagnosis not present

## 2018-09-12 DIAGNOSIS — J45909 Unspecified asthma, uncomplicated: Secondary | ICD-10-CM | POA: Diagnosis not present

## 2018-09-12 DIAGNOSIS — K769 Liver disease, unspecified: Secondary | ICD-10-CM | POA: Diagnosis not present

## 2018-09-12 DIAGNOSIS — R001 Bradycardia, unspecified: Secondary | ICD-10-CM | POA: Diagnosis not present

## 2018-09-12 DIAGNOSIS — K439 Ventral hernia without obstruction or gangrene: Secondary | ICD-10-CM | POA: Diagnosis not present

## 2018-09-19 DIAGNOSIS — K430 Incisional hernia with obstruction, without gangrene: Secondary | ICD-10-CM | POA: Diagnosis not present

## 2018-09-19 DIAGNOSIS — J45909 Unspecified asthma, uncomplicated: Secondary | ICD-10-CM | POA: Diagnosis not present

## 2018-09-19 DIAGNOSIS — K432 Incisional hernia without obstruction or gangrene: Secondary | ICD-10-CM | POA: Diagnosis not present

## 2018-09-19 DIAGNOSIS — T83728A Exposure of other implanted mesh and other prosthetic materials to surrounding organ or tissue, initial encounter: Secondary | ICD-10-CM | POA: Diagnosis not present

## 2018-09-19 DIAGNOSIS — R339 Retention of urine, unspecified: Secondary | ICD-10-CM | POA: Diagnosis not present

## 2018-09-19 DIAGNOSIS — K769 Liver disease, unspecified: Secondary | ICD-10-CM | POA: Diagnosis not present

## 2018-09-19 DIAGNOSIS — Z8639 Personal history of other endocrine, nutritional and metabolic disease: Secondary | ICD-10-CM | POA: Diagnosis not present

## 2018-10-12 ENCOUNTER — Encounter (HOSPITAL_BASED_OUTPATIENT_CLINIC_OR_DEPARTMENT_OTHER): Payer: Self-pay

## 2018-10-12 ENCOUNTER — Other Ambulatory Visit: Payer: Self-pay

## 2018-10-12 ENCOUNTER — Emergency Department (HOSPITAL_BASED_OUTPATIENT_CLINIC_OR_DEPARTMENT_OTHER): Payer: No Typology Code available for payment source

## 2018-10-12 ENCOUNTER — Emergency Department (HOSPITAL_BASED_OUTPATIENT_CLINIC_OR_DEPARTMENT_OTHER)
Admission: EM | Admit: 2018-10-12 | Discharge: 2018-10-12 | Disposition: A | Discharge status: Home / Self Care | Payer: No Typology Code available for payment source | Attending: Emergency Medicine | Admitting: Emergency Medicine

## 2018-10-12 DIAGNOSIS — S299XXA Unspecified injury of thorax, initial encounter: Secondary | ICD-10-CM | POA: Insufficient documentation

## 2018-10-12 DIAGNOSIS — M546 Pain in thoracic spine: Secondary | ICD-10-CM

## 2018-10-12 DIAGNOSIS — S3991XA Unspecified injury of abdomen, initial encounter: Secondary | ICD-10-CM | POA: Insufficient documentation

## 2018-10-12 DIAGNOSIS — M62838 Other muscle spasm: Secondary | ICD-10-CM | POA: Diagnosis not present

## 2018-10-12 DIAGNOSIS — R109 Unspecified abdominal pain: Secondary | ICD-10-CM | POA: Diagnosis not present

## 2018-10-12 DIAGNOSIS — I10 Essential (primary) hypertension: Secondary | ICD-10-CM | POA: Diagnosis not present

## 2018-10-12 DIAGNOSIS — Y998 Other external cause status: Secondary | ICD-10-CM | POA: Diagnosis not present

## 2018-10-12 DIAGNOSIS — Y92410 Unspecified street and highway as the place of occurrence of the external cause: Secondary | ICD-10-CM | POA: Insufficient documentation

## 2018-10-12 DIAGNOSIS — Y93I9 Activity, other involving external motion: Secondary | ICD-10-CM | POA: Insufficient documentation

## 2018-10-12 DIAGNOSIS — R1084 Generalized abdominal pain: Secondary | ICD-10-CM

## 2018-10-12 DIAGNOSIS — S24109A Unspecified injury at unspecified level of thoracic spinal cord, initial encounter: Secondary | ICD-10-CM | POA: Diagnosis not present

## 2018-10-12 LAB — CBC WITH DIFFERENTIAL/PLATELET
Abs Immature Granulocytes: 0.02 10*3/uL (ref 0.00–0.07)
Basophils Absolute: 0 10*3/uL (ref 0.0–0.1)
Basophils Relative: 1 %
Eosinophils Absolute: 0.2 10*3/uL (ref 0.0–0.5)
Eosinophils Relative: 4 %
HCT: 34 % — ABNORMAL LOW (ref 39.0–52.0)
Hemoglobin: 10.7 g/dL — ABNORMAL LOW (ref 13.0–17.0)
Immature Granulocytes: 1 %
Lymphocytes Relative: 15 %
Lymphs Abs: 0.6 10*3/uL — ABNORMAL LOW (ref 0.7–4.0)
MCH: 27.3 pg (ref 26.0–34.0)
MCHC: 31.5 g/dL (ref 30.0–36.0)
MCV: 86.7 fL (ref 80.0–100.0)
Monocytes Absolute: 0.4 10*3/uL (ref 0.1–1.0)
Monocytes Relative: 9 %
Neutro Abs: 2.8 10*3/uL (ref 1.7–7.7)
Neutrophils Relative %: 70 %
Platelets: 187 10*3/uL (ref 150–400)
RBC: 3.92 MIL/uL — ABNORMAL LOW (ref 4.22–5.81)
RDW: 15.7 % — ABNORMAL HIGH (ref 11.5–15.5)
WBC: 4 10*3/uL (ref 4.0–10.5)
nRBC: 0 % (ref 0.0–0.2)

## 2018-10-12 LAB — COMPREHENSIVE METABOLIC PANEL
ALT: 11 U/L (ref 0–44)
AST: 18 U/L (ref 15–41)
Albumin: 3.5 g/dL (ref 3.5–5.0)
Alkaline Phosphatase: 58 U/L (ref 38–126)
Anion gap: 8 (ref 5–15)
BUN: 35 mg/dL — ABNORMAL HIGH (ref 6–20)
CO2: 22 mmol/L (ref 22–32)
Calcium: 8.3 mg/dL — ABNORMAL LOW (ref 8.9–10.3)
Chloride: 109 mmol/L (ref 98–111)
Creatinine, Ser: 2.85 mg/dL — ABNORMAL HIGH (ref 0.61–1.24)
GFR calc Af Amer: 27 mL/min — ABNORMAL LOW (ref >60)
GFR calc non Af Amer: 23 mL/min — ABNORMAL LOW (ref >60)
Glucose, Bld: 81 mg/dL (ref 70–99)
Potassium: 3.8 mmol/L (ref 3.5–5.1)
Sodium: 139 mmol/L (ref 135–145)
Total Bilirubin: 0.6 mg/dL (ref 0.3–1.2)
Total Protein: 6.7 g/dL (ref 6.5–8.1)

## 2018-10-12 MED ORDER — TIZANIDINE HCL 2 MG PO TABS: 2.0000 mg | ORAL_TABLET | Freq: Four times a day (QID) | ORAL | 0 refills | Status: DC | PRN

## 2018-10-12 MED ORDER — ONDANSETRON HCL 4 MG/2ML IJ SOLN
4.0000 mg | Freq: Once | INTRAMUSCULAR | Status: DC
  Filled 2018-10-12: qty 2

## 2018-10-12 MED ORDER — OXYCODONE HCL 5 MG PO TABS: 5.0000 mg | ORAL_TABLET | Freq: Four times a day (QID) | ORAL | 0 refills | Status: DC | PRN

## 2018-10-12 MED ORDER — MORPHINE SULFATE (PF) 4 MG/ML IV SOLN
4.0000 mg | Freq: Once | INTRAVENOUS | Status: DC
  Filled 2018-10-12: qty 1
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
## 2018-10-12 NOTE — Discharge Instructions (Addendum)
Your scan is reassuring it shows a small fluid collection in the area of your hernia repair but this is thought to be related to recent surgery rather than from trauma we did not see any other acute changes and your x-ray is overall reassuring.

## 2018-10-25 DIAGNOSIS — M542 Cervicalgia: Secondary | ICD-10-CM | POA: Diagnosis not present

## 2018-10-25 DIAGNOSIS — M50822 Other cervical disc disorders at C5-C6 level: Secondary | ICD-10-CM | POA: Diagnosis not present

## 2018-10-25 DIAGNOSIS — M545 Low back pain: Secondary | ICD-10-CM | POA: Diagnosis not present

## 2019-02-26 DIAGNOSIS — I129 Hypertensive chronic kidney disease with stage 1 through stage 4 chronic kidney disease, or unspecified chronic kidney disease: Secondary | ICD-10-CM | POA: Diagnosis not present

## 2019-02-26 DIAGNOSIS — E790 Hyperuricemia without signs of inflammatory arthritis and tophaceous disease: Secondary | ICD-10-CM | POA: Diagnosis not present

## 2019-02-26 DIAGNOSIS — R801 Persistent proteinuria, unspecified: Secondary | ICD-10-CM | POA: Diagnosis not present

## 2019-02-26 DIAGNOSIS — E039 Hypothyroidism, unspecified: Secondary | ICD-10-CM | POA: Diagnosis not present

## 2019-02-26 DIAGNOSIS — N2581 Secondary hyperparathyroidism of renal origin: Secondary | ICD-10-CM | POA: Diagnosis not present

## 2019-02-26 DIAGNOSIS — Z79899 Other long term (current) drug therapy: Secondary | ICD-10-CM | POA: Diagnosis not present

## 2019-02-26 DIAGNOSIS — N184 Chronic kidney disease, stage 4 (severe): Secondary | ICD-10-CM | POA: Diagnosis not present

## 2019-02-26 DIAGNOSIS — E785 Hyperlipidemia, unspecified: Secondary | ICD-10-CM | POA: Diagnosis not present

## 2019-02-26 DIAGNOSIS — R809 Proteinuria, unspecified: Secondary | ICD-10-CM | POA: Diagnosis not present

## 2019-03-06 DIAGNOSIS — R809 Proteinuria, unspecified: Secondary | ICD-10-CM | POA: Diagnosis not present

## 2019-03-06 DIAGNOSIS — Z888 Allergy status to other drugs, medicaments and biological substances status: Secondary | ICD-10-CM | POA: Diagnosis not present

## 2019-03-06 DIAGNOSIS — N184 Chronic kidney disease, stage 4 (severe): Secondary | ICD-10-CM | POA: Diagnosis not present

## 2019-03-06 DIAGNOSIS — Z9889 Other specified postprocedural states: Secondary | ICD-10-CM | POA: Diagnosis not present

## 2019-03-06 DIAGNOSIS — M109 Gout, unspecified: Secondary | ICD-10-CM | POA: Diagnosis not present

## 2019-03-06 DIAGNOSIS — Z1159 Encounter for screening for other viral diseases: Secondary | ICD-10-CM | POA: Diagnosis not present

## 2019-03-06 DIAGNOSIS — K7460 Unspecified cirrhosis of liver: Secondary | ICD-10-CM | POA: Diagnosis not present

## 2019-03-06 DIAGNOSIS — N183 Chronic kidney disease, stage 3 unspecified: Secondary | ICD-10-CM | POA: Diagnosis not present

## 2019-03-06 DIAGNOSIS — I129 Hypertensive chronic kidney disease with stage 1 through stage 4 chronic kidney disease, or unspecified chronic kidney disease: Secondary | ICD-10-CM | POA: Diagnosis not present

## 2019-03-06 DIAGNOSIS — E785 Hyperlipidemia, unspecified: Secondary | ICD-10-CM | POA: Diagnosis not present

## 2019-03-06 DIAGNOSIS — K439 Ventral hernia without obstruction or gangrene: Secondary | ICD-10-CM | POA: Diagnosis not present

## 2019-03-06 DIAGNOSIS — Z79899 Other long term (current) drug therapy: Secondary | ICD-10-CM | POA: Diagnosis not present

## 2019-03-06 DIAGNOSIS — R634 Abnormal weight loss: Secondary | ICD-10-CM | POA: Diagnosis not present

## 2019-03-06 DIAGNOSIS — E039 Hypothyroidism, unspecified: Secondary | ICD-10-CM | POA: Diagnosis not present

## 2019-03-06 DIAGNOSIS — Z87891 Personal history of nicotine dependence: Secondary | ICD-10-CM | POA: Diagnosis not present

## 2019-03-06 DIAGNOSIS — D72819 Decreased white blood cell count, unspecified: Secondary | ICD-10-CM | POA: Diagnosis not present

## 2019-07-09 DIAGNOSIS — Z800 Family history of malignant neoplasm of digestive organs: Secondary | ICD-10-CM | POA: Diagnosis not present

## 2019-07-09 DIAGNOSIS — E790 Hyperuricemia without signs of inflammatory arthritis and tophaceous disease: Secondary | ICD-10-CM | POA: Diagnosis not present

## 2019-07-09 DIAGNOSIS — E785 Hyperlipidemia, unspecified: Secondary | ICD-10-CM | POA: Diagnosis not present

## 2019-07-09 DIAGNOSIS — N2581 Secondary hyperparathyroidism of renal origin: Secondary | ICD-10-CM | POA: Diagnosis not present

## 2019-07-09 DIAGNOSIS — K432 Incisional hernia without obstruction or gangrene: Secondary | ICD-10-CM | POA: Diagnosis not present

## 2019-07-09 DIAGNOSIS — R801 Persistent proteinuria, unspecified: Secondary | ICD-10-CM | POA: Diagnosis not present

## 2019-07-09 DIAGNOSIS — Z87891 Personal history of nicotine dependence: Secondary | ICD-10-CM | POA: Diagnosis not present

## 2019-07-09 DIAGNOSIS — K7460 Unspecified cirrhosis of liver: Secondary | ICD-10-CM | POA: Diagnosis not present

## 2019-07-09 DIAGNOSIS — Z79899 Other long term (current) drug therapy: Secondary | ICD-10-CM | POA: Diagnosis not present

## 2019-07-09 DIAGNOSIS — M109 Gout, unspecified: Secondary | ICD-10-CM | POA: Diagnosis not present

## 2019-07-09 DIAGNOSIS — N184 Chronic kidney disease, stage 4 (severe): Secondary | ICD-10-CM | POA: Diagnosis not present

## 2019-07-09 DIAGNOSIS — E559 Vitamin D deficiency, unspecified: Secondary | ICD-10-CM | POA: Diagnosis not present

## 2019-07-09 DIAGNOSIS — I129 Hypertensive chronic kidney disease with stage 1 through stage 4 chronic kidney disease, or unspecified chronic kidney disease: Secondary | ICD-10-CM | POA: Diagnosis not present

## 2019-07-09 DIAGNOSIS — E890 Postprocedural hypothyroidism: Secondary | ICD-10-CM | POA: Diagnosis not present

## 2019-07-09 DIAGNOSIS — J45909 Unspecified asthma, uncomplicated: Secondary | ICD-10-CM | POA: Diagnosis not present

## 2019-07-09 DIAGNOSIS — Z9889 Other specified postprocedural states: Secondary | ICD-10-CM | POA: Diagnosis not present

## 2019-07-26 DIAGNOSIS — M109 Gout, unspecified: Secondary | ICD-10-CM | POA: Diagnosis not present

## 2019-07-26 DIAGNOSIS — I129 Hypertensive chronic kidney disease with stage 1 through stage 4 chronic kidney disease, or unspecified chronic kidney disease: Secondary | ICD-10-CM | POA: Diagnosis not present

## 2019-07-26 DIAGNOSIS — E039 Hypothyroidism, unspecified: Secondary | ICD-10-CM | POA: Diagnosis not present

## 2019-07-26 DIAGNOSIS — Z79899 Other long term (current) drug therapy: Secondary | ICD-10-CM | POA: Diagnosis not present

## 2019-07-26 DIAGNOSIS — N184 Chronic kidney disease, stage 4 (severe): Secondary | ICD-10-CM | POA: Diagnosis not present

## 2019-07-26 DIAGNOSIS — E785 Hyperlipidemia, unspecified: Secondary | ICD-10-CM | POA: Diagnosis not present

## 2019-07-26 DIAGNOSIS — E790 Hyperuricemia without signs of inflammatory arthritis and tophaceous disease: Secondary | ICD-10-CM | POA: Diagnosis not present

## 2019-07-26 DIAGNOSIS — R809 Proteinuria, unspecified: Secondary | ICD-10-CM | POA: Diagnosis not present
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
## 2020-04-25 ENCOUNTER — Other Ambulatory Visit: Payer: Self-pay

## 2020-04-25 ENCOUNTER — Emergency Department (HOSPITAL_BASED_OUTPATIENT_CLINIC_OR_DEPARTMENT_OTHER): Payer: Self-pay

## 2020-04-25 ENCOUNTER — Emergency Department (HOSPITAL_BASED_OUTPATIENT_CLINIC_OR_DEPARTMENT_OTHER)
Admission: EM | Admit: 2020-04-25 | Discharge: 2020-04-25 | Disposition: A | Discharge status: Home / Self Care | Payer: Self-pay | Attending: Emergency Medicine | Admitting: Emergency Medicine

## 2020-04-25 ENCOUNTER — Encounter (HOSPITAL_BASED_OUTPATIENT_CLINIC_OR_DEPARTMENT_OTHER): Payer: Self-pay | Admitting: *Deleted

## 2020-04-25 DIAGNOSIS — Z7982 Long term (current) use of aspirin: Secondary | ICD-10-CM | POA: Insufficient documentation

## 2020-04-25 DIAGNOSIS — M5412 Radiculopathy, cervical region: Secondary | ICD-10-CM | POA: Insufficient documentation

## 2020-04-25 DIAGNOSIS — M79602 Pain in left arm: Secondary | ICD-10-CM | POA: Insufficient documentation

## 2020-04-25 DIAGNOSIS — Z79899 Other long term (current) drug therapy: Secondary | ICD-10-CM | POA: Insufficient documentation

## 2020-04-25 DIAGNOSIS — I10 Essential (primary) hypertension: Secondary | ICD-10-CM | POA: Insufficient documentation

## 2020-04-25 DIAGNOSIS — Z87891 Personal history of nicotine dependence: Secondary | ICD-10-CM | POA: Insufficient documentation

## 2020-04-25 MED ORDER — CYCLOBENZAPRINE HCL 10 MG PO TABS: 10.0000 mg | ORAL_TABLET | Freq: Three times a day (TID) | ORAL | 0 refills | Status: DC | PRN

## 2020-04-25 MED ORDER — ACETAMINOPHEN 500 MG PO TABS
1000.0000 mg | ORAL_TABLET | Freq: Once | ORAL | Status: AC
  Administered 2020-04-25: 1000 mg via ORAL
  Filled 2020-04-25: qty 2

## 2020-04-25 MED ORDER — PREDNISONE 20 MG PO TABS: ORAL_TABLET | ORAL | 0 refills | Status: DC

## 2020-04-25 NOTE — Discharge Instructions (Addendum)
If you develop worsening, recurrent, or continued neck/arm pain, numbness or weakness in the arms or legs, incontinence of your bowels or bladders, numbness of your buttocks, fever, chest pain, or any other new/concerning symptoms then return to the ER for evaluation.

## 2020-04-25 NOTE — ED Triage Notes (Signed)
C/o left arm numbness, tingling w a sharp pain shooting through fingers  That comes and goes lasting up to 10 min States weakness when having the pains onset 3 weeks ago

## 2020-04-25 NOTE — ED Provider Notes (Signed)
[facility] EMERGENCY DEPARTMENT Provider Note   CSN: [id number] Arrival date & time: 04/25/20  1216     History No chief complaint on file.   Hector York is a 60 y.o. male.  HPI 60 year old male presents with left arm tingling and pain.  Ongoing for about 3 weeks.  It is pretty much a constant sensation unless he is distracted or working.  The primary sensation is a tingling/pins-and-needles with pain.  He has not taken anything.  The pain goes from his hand up his arm into his left back/neck.  No injuries.  Sometimes turning his head (not sure which way) will make the arm hurt worse.  There is no numbness or weakness in the extremity.  No chest pain or shortness of breath.   Past Medical History:  Diagnosis Date  . Hypertension   . Renal disorder    unknown problem, followed by Dr. at [facility]  . Thyroid disease     There are no problems to display for this patient.   Past Surgical History:  Procedure Laterality Date  . HERNIA REPAIR         No family history on file.  Social History   Tobacco Use  . Smoking status: Former Research scientist (life sciences)  . Smokeless tobacco: Never Used  Vaping Use  . Vaping Use: Never used  Substance Use Topics  . Alcohol use: No  . Drug use: No    Home Medications Prior to Admission medications   Medication Sig Start Date End Date Taking? Authorizing Provider  amlodipine-atorvastatin (CADUET) 10-10 MG tablet Take 1 tablet by mouth daily.   Yes [provider]  aspirin 81 MG chewable tablet Chew by mouth daily.   Yes [provider]  calcitRIOL (ROCALTROL) 0.25 MCG capsule Take 0.25 mcg by mouth daily.   Yes [provider]  carvedilol (COREG) 3.125 MG tablet Take 3.125 mg by mouth 2 (two) times daily with a meal.   Yes [provider]  cyclobenzaprine (FLEXERIL) 10 MG tablet Take 1 tablet (10 mg total) by mouth 3 (three) times daily as needed for muscle spasms. 04/25/20  Yes Sherwood Gambler, MD   predniSONE (DELTASONE) 20 MG tablet 3 tabs po daily x 3 days, then 2 tabs x 3 days, then 1.5 tabs x 3 days, then 1 tab x 3 days, then 0.5 tabs x 3 days 04/25/20  Yes Sherwood Gambler, MD  LEVOTHYROXINE SODIUM PO Take by mouth.    [provider]  SIMVASTATIN PO Take by mouth.    [provider]  LISINOPRIL PO Take by mouth.  04/25/20  [provider]    Allergies    Lisinopril and Other  Review of Systems   Review of Systems  Constitutional: Negative for fever.  Respiratory: Negative for shortness of breath.   Cardiovascular: Negative for chest pain.  Musculoskeletal: Positive for myalgias and neck pain.  Neurological: Negative for weakness and numbness.  All other systems reviewed and are negative.   Physical Exam Updated Vital Signs BP 110/68 (BP Location: Right Arm)   Pulse (!) 59   Temp 98.4 F (36.9 C) (Oral)   Resp 16   Ht '5\' 9"'$  (1.753 m)   Wt 74.8 kg   SpO2 100%   BMI 24.37 kg/m   Physical Exam Vitals and nursing note reviewed.  Constitutional:      General: He is not in acute distress.    Appearance: He is well-developed. He is not ill-appearing or diaphoretic.  HENT:     Head: Normocephalic and atraumatic.     Right Ear: External ear normal.     Left Ear: External ear normal.     Nose: Nose normal.  Eyes:     General:        Right eye: No discharge.        Left eye: No discharge.  Cardiovascular:     Rate and Rhythm: Normal rate and regular rhythm.     Pulses:          Radial pulses are 2+ on the left side.     Heart sounds: Normal heart sounds.  Pulmonary:     Effort: Pulmonary effort is normal.     Breath sounds: Normal breath sounds.  Abdominal:     Palpations: Abdomen is soft.     Tenderness: There is no abdominal tenderness.  Musculoskeletal:     Cervical back: Normal range of motion and neck supple. No spinous process tenderness or muscular tenderness.  Skin:    General: Skin is warm and dry.  Neurological:      Mental Status: He is alert.     Comments: 5/5 strength in both upper extremities. Grossly normal sensation throughout  Psychiatric:        Mood and Affect: Mood is not anxious.     ED Results / Procedures / Treatments   Labs (all labs ordered are listed, but only abnormal results are displayed) Labs Reviewed - No data to display  EKG EKG Interpretation  Date/Time:  Friday April 25 2020 13:43:14 EDT Ventricular Rate:  49 PR Interval:    QRS Duration: 97 QT Interval:  508 QTC Calculation: 459 R Axis:   8 Text Interpretation: Sinus bradycardia Anteroseptal infarct, old Borderline T abnormalities, inferior leads No old tracing to compare Confirmed by Sherwood Gambler [phone]) on 04/25/2020 1:53:43 PM   Radiology DG Cervical Spine Complete  Result Date: 04/25/2020 CLINICAL DATA:  Cervicalgia with left-sided radicular symptoms EXAM: CERVICAL SPINE - COMPLETE 4+ VIEW COMPARISON:  October 25, 2018. FINDINGS: Frontal, lateral, open-mouth odontoid, and bilateral oblique views were obtained. There is mild cervical levoscoliosis. There is no fracture or spondylolisthesis. Prevertebral soft tissues and predental space regions are normal. There is moderately severe disc space narrowing at C5-6 and C6-7. Anterior osteophytes are noted at C5 and C6. There is facet hypertrophy at C5-6 and C6-7 on the right. Facets at other levels appear unremarkable. There is lack of lordosis. Lung apices are clear. 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 No fracture or spondylolisthesis. Lack of lordosis and slight scoliosis may be indicative of muscle spasm. Electronically Signed   By: Lowella Grip III M.D.   On: 04/25/2020 14:00    Procedures Procedures   Medications Ordered in ED Medications  acetaminophen (TYLENOL) tablet 1,000 mg (1,000 mg Oral Given 04/25/20 1307)    ED Course  I have reviewed the triage vital signs and the nursing notes.  Pertinent labs & imaging results that were  available during my care of the patient were reviewed by me and considered in my medical decision making (see chart for details).    MDM Rules/Calculators/A&P                          Patient's presentation is most consistent with cervical radiculopathy.  Given he has chronic kidney disease, NSAIDs would not be indicated at this time.  Otherwise, x-ray does not show any obvious fracture/lesions.  We will  try steroid taper as well as Tylenol and muscle relaxer.  No emergent neurologic findings on exam and so he does not need an emergency MRI but this may be needed if he does not improve.  Follow-up with his PCP.  Given return precautions Final Clinical Impression(s) / ED Diagnoses Final diagnoses:  Cervical radiculopathy    Rx / DC Orders ED Discharge Orders         Ordered    cyclobenzaprine (FLEXERIL) 10 MG tablet  3 times daily PRN        04/25/20 1420    predniSONE (DELTASONE) 20 MG tablet        04/25/20 1420           Sherwood Gambler, MD 04/25/20 1436

## 2020-06-09 ENCOUNTER — Encounter (HOSPITAL_BASED_OUTPATIENT_CLINIC_OR_DEPARTMENT_OTHER): Payer: Self-pay | Admitting: *Deleted

## 2020-06-09 ENCOUNTER — Emergency Department (HOSPITAL_BASED_OUTPATIENT_CLINIC_OR_DEPARTMENT_OTHER)
Admission: EM | Admit: 2020-06-09 | Discharge: 2020-06-09 | Disposition: A | Discharge status: Home / Self Care | Payer: Self-pay | Attending: Emergency Medicine | Admitting: Emergency Medicine

## 2020-06-09 ENCOUNTER — Emergency Department (HOSPITAL_BASED_OUTPATIENT_CLINIC_OR_DEPARTMENT_OTHER): Payer: Self-pay

## 2020-06-09 ENCOUNTER — Other Ambulatory Visit: Payer: Self-pay

## 2020-06-09 DIAGNOSIS — I10 Essential (primary) hypertension: Secondary | ICD-10-CM | POA: Insufficient documentation

## 2020-06-09 DIAGNOSIS — Y990 Civilian activity done for income or pay: Secondary | ICD-10-CM | POA: Insufficient documentation

## 2020-06-09 DIAGNOSIS — Z79899 Other long term (current) drug therapy: Secondary | ICD-10-CM | POA: Insufficient documentation

## 2020-06-09 DIAGNOSIS — Z87891 Personal history of nicotine dependence: Secondary | ICD-10-CM | POA: Insufficient documentation

## 2020-06-09 DIAGNOSIS — Z7982 Long term (current) use of aspirin: Secondary | ICD-10-CM | POA: Insufficient documentation

## 2020-06-09 DIAGNOSIS — M25532 Pain in left wrist: Secondary | ICD-10-CM | POA: Insufficient documentation

## 2020-06-09 DIAGNOSIS — Y9389 Activity, other specified: Secondary | ICD-10-CM | POA: Insufficient documentation

## 2020-06-09 DIAGNOSIS — W1839XA Other fall on same level, initial encounter: Secondary | ICD-10-CM | POA: Insufficient documentation

## 2020-06-09 MED ORDER — ACETAMINOPHEN 325 MG PO TABS
650.0000 mg | ORAL_TABLET | Freq: Once | ORAL | Status: AC
  Administered 2020-06-09: 650 mg via ORAL
  Filled 2020-06-09: qty 2

## 2020-06-09 NOTE — Discharge Instructions (Addendum)
Call your primary care doctor or specialist as discussed in the next 2-3 days.   Return immediately back to the ER if:  Your symptoms worsen within the next 12-24 hours. You develop new symptoms such as new fevers, persistent vomiting, new pain, shortness of breath, or new weakness or numbness, or if you have any other concerns.  

## 2020-06-09 NOTE — ED Provider Notes (Signed)
[facility] EMERGENCY DEPARTMENT Provider Note   CSN: [id number] Arrival date & time: 06/09/20  1435     History Chief Complaint  Patient presents with  . Fall    Hector York is a 60 y.o. male.  Patient presents to ER chief complaint of left wrist pain.  Describes it as sharp and severe persistent.  He states he was at work when he was pulling on something which gave way and he fell backwards and fell onto his left outstretched wrist.  Complaining of significant pain at that area that is persistent.  Denies pain elsewhere denies headache or head injury.  Denies neck or back pain.  Denies chest pain.  Denies loss of consciousness.        Past Medical History:  Diagnosis Date  . Hypertension   . Renal disorder    unknown problem, followed by Dr. at [facility]  . Thyroid disease     There are no problems to display for this patient.   Past Surgical History:  Procedure Laterality Date  . HERNIA REPAIR         No family history on file.  Social History   Tobacco Use  . Smoking status: Former Research scientist (life sciences)  . Smokeless tobacco: Never Used  Vaping Use  . Vaping Use: Never used  Substance Use Topics  . Alcohol use: No  . Drug use: No    Home Medications Prior to Admission medications   Medication Sig Start Date End Date Taking? Authorizing Provider  amlodipine-atorvastatin (CADUET) 10-10 MG tablet Take 1 tablet by mouth daily.    [provider]  aspirin 81 MG chewable tablet Chew by mouth daily.    [provider]  calcitRIOL (ROCALTROL) 0.25 MCG capsule Take 0.25 mcg by mouth daily.    [provider]  carvedilol (COREG) 3.125 MG tablet Take 3.125 mg by mouth 2 (two) times daily with a meal.    [provider]  cyclobenzaprine (FLEXERIL) 10 MG tablet Take 1 tablet (10 mg total) by mouth 3 (three) times daily as needed for muscle spasms. 04/25/20   Sherwood Gambler, MD  LEVOTHYROXINE SODIUM PO Take by mouth.     [provider]  predniSONE (DELTASONE) 20 MG tablet 3 tabs po daily x 3 days, then 2 tabs x 3 days, then 1.5 tabs x 3 days, then 1 tab x 3 days, then 0.5 tabs x 3 days 04/25/20   Sherwood Gambler, MD  SIMVASTATIN PO Take by mouth.    [provider]  LISINOPRIL PO Take by mouth.  04/25/20  [provider]    Allergies    Lisinopril and Other  Review of Systems   Review of Systems  Constitutional: Negative for fever.  HENT: Negative for ear pain and sore throat.   Eyes: Negative for pain.  Respiratory: Negative for cough.   Cardiovascular: Negative for chest pain.  Gastrointestinal: Negative for abdominal pain.  Genitourinary: Negative for flank pain.  Musculoskeletal: Negative for back pain.  Skin: Negative for color change and rash.  Neurological: Negative for syncope.  All other systems reviewed and are negative.   Physical Exam Updated Vital Signs BP (!) 136/93   Pulse (!) 52   Temp 98.1 F (36.7 C) (Oral)   Resp 17   Ht '5\' 9"'$  (1.753 m)   Wt 73 kg   SpO2 100%   BMI 23.78 kg/m   Physical Exam Constitutional:      General: He is not in  acute distress.    Appearance: He is well-developed.  HENT:     Head: Normocephalic.     Nose: Nose normal.  Eyes:     Extraocular Movements: Extraocular movements intact.  Cardiovascular:     Rate and Rhythm: Normal rate.  Pulmonary:     Effort: Pulmonary effort is normal.  Musculoskeletal:     Comments: Tenderness to palpation in the left to distal forearm region.  No snuffbox tenderness noted.  Neurovascular intact distally.  Is otherwise soft.  Skin:    Coloration: Skin is not jaundiced.  Neurological:     Mental Status: He is alert. Mental status is at baseline.     ED Results / Procedures / Treatments   Labs (all labs ordered are listed, but only abnormal results are displayed) Labs Reviewed - No data to display  EKG EKG Interpretation  Date/Time:  Monday Jun 09 2020 15:01:45  EDT Ventricular Rate:  50 PR Interval:  186 QRS Duration: 88 QT Interval:  478 QTC Calculation: 435 R Axis:   11 Text Interpretation: Sinus bradycardia Nonspecific T wave abnormality Abnormal ECG Confirmed by Thamas Jaegers ([id number]) on 06/09/2020 3:57:43 PM   Radiology DG Forearm Left  Result Date: 06/09/2020 CLINICAL DATA:  [place] at work.  Left hand and wrist and arm pain. EXAM: LEFT FOREARM - 2 VIEW COMPARISON:  None. FINDINGS: There is no evidence of fracture or other focal bone lesions. Soft tissues are unremarkable. IMPRESSION: Negative. Electronically Signed   By: Lajean Manes M.D.   On: 06/09/2020 15:52    Procedures Procedures   Medications Ordered in ED Medications  acetaminophen (TYLENOL) tablet 650 mg (650 mg Oral Given 06/09/20 1554)    ED Course  I have reviewed the triage vital signs and the nursing notes.  Pertinent labs & imaging results that were available during my care of the patient were reviewed by me and considered in my medical decision making (see chart for details).    MDM Rules/Calculators/A&P                          X-ray of the left forearm shows no acute fracture per radiology.  Patient given Tylenol for pain management.  Placed in a wrist.  Neurovascularly intact after placement of splint.  Will advise outpatient follow-up with his doctor within the week, advised immediate return for worsening pain fevers or any additional concerns.  During his ER stay patient became lightheaded, blood pressure appeared low, however this appears transient and subsequently returned back to normal blood pressure levels.  No additional adverse events noted during his ER stay.  EKG otherwise shows sinus rhythm no ST elevation depressions normal rate.   Final Clinical Impression(s) / ED Diagnoses Final diagnoses:  Left wrist pain    Rx / DC Orders ED Discharge Orders    None       Luna Fuse, MD 06/09/20 1600

## 2020-06-09 NOTE — ED Triage Notes (Signed)
C/o fall today at work c/o left hand wrist and arm pain .

## 2020-06-09 NOTE — ED Notes (Addendum)
During triage pt became diaphoretic, BP  70/30, HR 42, pale , and reported " im going to vomit" pts head lowered , legs elevated and BP increased to 100/70 HR 48. Pt remains pale  And slow to respond. Pt moved to hall on stretcher charge nurse made aware. EKG done

## 2021-07-06 ENCOUNTER — Encounter (HOSPITAL_BASED_OUTPATIENT_CLINIC_OR_DEPARTMENT_OTHER): Payer: Self-pay | Admitting: Pediatrics

## 2021-07-06 ENCOUNTER — Emergency Department (HOSPITAL_BASED_OUTPATIENT_CLINIC_OR_DEPARTMENT_OTHER)
Admission: EM | Admit: 2021-07-06 | Discharge: 2021-07-06 | Disposition: A | Discharge status: Home / Self Care | Payer: Self-pay | Attending: Emergency Medicine | Admitting: Emergency Medicine

## 2021-07-06 ENCOUNTER — Other Ambulatory Visit: Payer: Self-pay

## 2021-07-06 DIAGNOSIS — R21 Rash and other nonspecific skin eruption: Secondary | ICD-10-CM

## 2021-07-06 DIAGNOSIS — Z202 Contact with and (suspected) exposure to infections with a predominantly sexual mode of transmission: Secondary | ICD-10-CM | POA: Insufficient documentation

## 2021-07-06 LAB — URINALYSIS, MICROSCOPIC (REFLEX): WBC, UA: NONE SEEN WBC/hpf (ref 0–5)

## 2021-07-06 LAB — URINALYSIS, ROUTINE W REFLEX MICROSCOPIC
Bilirubin Urine: NEGATIVE
Glucose, UA: NEGATIVE mg/dL
Ketones, ur: NEGATIVE mg/dL
Leukocytes,Ua: NEGATIVE
Nitrite: NEGATIVE
Protein, ur: 100 mg/dL — AB
Specific Gravity, Urine: 1.015 (ref 1.005–1.030)
pH: 5.5 (ref 5.0–8.0)

## 2021-07-06 MED ORDER — HYDROCORTISONE 1 % EX CREA: TOPICAL_CREAM | CUTANEOUS | 0 refills | Status: AC

## 2021-07-06 MED ORDER — METRONIDAZOLE 500 MG PO TABS: 500.0000 mg | ORAL_TABLET | Freq: Two times a day (BID) | ORAL | 0 refills | Status: DC

## 2021-07-06 MED ORDER — FLUCONAZOLE 150 MG PO TABS
150.0000 mg | ORAL_TABLET | Freq: Once | ORAL | Status: AC
  Administered 2021-07-06: 150 mg via ORAL
  Filled 2021-07-06: qty 1

## 2021-07-06 NOTE — Discharge Instructions (Signed)
Please take antibiotics as prescribed.  You can use the hydrocortisone cream of the scrotum.  Please follow-up with your primary care provider if you have 1.  If any of these test come back positive you will be notified.  You may return to the emergency department for any worsening symptoms you might have.

## 2021-07-06 NOTE — ED Notes (Signed)
Discharge instructions, recommendations and meds reviewed with pt and family. Pt states understanding. Ambulatory. Pt discharged to home

## 2021-07-06 NOTE — ED Provider Notes (Signed)
[facility] EMERGENCY DEPARTMENT Provider Note   CSN: [id number] Arrival date & time: 07/06/21  1525     History Chief Complaint  Patient presents with   Exposure to STD    Hector York is a 61 y.o. male who presents emerged apartment today for further evaluation of a genital rash has been ongoing for 1 to 2 weeks.  He states the rash is quite pruritic.  He reports associated intermittent dysuria but denies any penile discharge.  No fevers or chills.   Exposure to STD      Home Medications Prior to Admission medications   Medication Sig Start Date End Date Taking? Authorizing Provider  hydrocortisone cream 1 % Apply to affected area 2 times daily 07/06/21  Yes Raul Del, Obadiah Dennard M, PA-C  metroNIDAZOLE (FLAGYL) 500 MG tablet Take 1 tablet (500 mg total) by mouth 2 (two) times daily. 07/06/21  Yes Reigna Ruperto M, PA-C  amlodipine-atorvastatin (CADUET) 10-10 MG tablet Take 1 tablet by mouth daily.    [provider]  aspirin 81 MG chewable tablet Chew by mouth daily.    [provider]  calcitRIOL (ROCALTROL) 0.25 MCG capsule Take 0.25 mcg by mouth daily.    [provider]  carvedilol (COREG) 3.125 MG tablet Take 3.125 mg by mouth 2 (two) times daily with a meal.    [provider]  cyclobenzaprine (FLEXERIL) 10 MG tablet Take 1 tablet (10 mg total) by mouth 3 (three) times daily as needed for muscle spasms. 04/25/20   Sherwood Gambler, MD  LEVOTHYROXINE SODIUM PO Take by mouth.    [provider]  predniSONE (DELTASONE) 20 MG tablet 3 tabs po daily x 3 days, then 2 tabs x 3 days, then 1.5 tabs x 3 days, then 1 tab x 3 days, then 0.5 tabs x 3 days 04/25/20   Sherwood Gambler, MD  SIMVASTATIN PO Take by mouth.    [provider]  LISINOPRIL PO Take by mouth.  04/25/20  [provider]      Allergies    Lisinopril and Other    Review of Systems   Review of Systems  All other systems reviewed and are  negative.  Physical Exam Updated Vital Signs BP 129/85 (BP Location: Left Arm)   Pulse (!) 59   Temp 98.7 F (37.1 C) (Oral)   Resp 18   Ht 5\' 9"  (1.753 m)   Wt 73.9 kg   SpO2 100%   BMI 24.07 kg/m  Physical Exam Vitals and nursing note reviewed.  Constitutional:      Appearance: Normal appearance.  HENT:     Head: Normocephalic and atraumatic.  Eyes:     General:        Right eye: No discharge.        Left eye: No discharge.     Conjunctiva/sclera: Conjunctivae normal.  Pulmonary:     Effort: Pulmonary effort is normal.  Genitourinary:    Pubic Area: Rash present.     Comments: There is evidence of scrotal rash and 2 areas.  There is evidence of hypopigmentation.  There is no evidence of vesicular or ulcerated lesions.  The rash is nontender to palpation.  No evidence of penile discharge that I can see. Skin:    General: Skin is warm and dry.     Findings: No rash.  Neurological:     General: No focal deficit present.     Mental Status: He is alert.  Psychiatric:  Mood and Affect: Mood normal.        Behavior: Behavior normal.    ED Results / Procedures / Treatments   Labs (all labs ordered are listed, but only abnormal results are displayed) Labs Reviewed  URINALYSIS, ROUTINE W REFLEX MICROSCOPIC - Abnormal; Notable for the following components:      Result Value   Hgb urine dipstick SMALL (*)    Protein, ur 100 (*)    All other components within normal limits  URINALYSIS, MICROSCOPIC (REFLEX) - Abnormal; Notable for the following components:   Bacteria, UA RARE (*)    All other components within normal limits  RPR  GC/CHLAMYDIA PROBE AMP ([place]) NOT AT [facility]    EKG None  Radiology No results found.  Procedures Procedures    Medications Ordered in ED Medications  fluconazole (DIFLUCAN) tablet 150 mg (has no administration in time range)    ED Course/ Medical Decision Making/ A&P Clinical Course as of 07/06/21 1741  Mon Jul 06, 2021  1734 Urinalysis, Routine w reflex microscopic Urine, Clean Catch(!) No evidence of UTI. [CF]  1735 GC/Chlamydia probe amp () not at [facility] urine was obtained and pending. [CF]  1735 RPR RPR in process. [CF]    Clinical Course User Index [CF] Hendricks Limes, PA-C                           Medical Decision Making Hector York is a 61 y.o. male patient who presents to the emergency department with scrotal rash.  I have a low suspicion for haemophilus ducreyi or syphilis as there is no ulcerations.  I doubt herpes.  This could be a candidal infection considering that he has been having a lot of itching.  We will give him fluconazole here and will give him some hydrocortisone cream to go home with.  I will have him follow-up with his PCP if he has 1.  He is safe for discharge.  Upon discharge, the significant other tell me that she just tested positive for trichomonas.  I will add on metronidazole for him to go home with.  Amount and/or Complexity of Data Reviewed Labs: ordered. Decision-making details documented in ED Course.  Risk Prescription drug management.   Final Clinical Impression(s) / ED Diagnoses Final diagnoses:  STD exposure  Scrotal rash    Rx / DC Orders ED Discharge Orders          Ordered    hydrocortisone cream 1 %        07/06/21 1740    metroNIDAZOLE (FLAGYL) 500 MG tablet  2 times daily        07/06/21 1740              Myna Bright [place], [place] 07/06/21 1741    Margette Fast, MD 07/07/21 1326

## 2021-07-06 NOTE — ED Triage Notes (Signed)
C/o genital itching x 1-2 weeks; concern for STD.

## 2021-07-07 LAB — RPR: RPR Ser Ql: NONREACTIVE

## 2021-07-07 LAB — GC/CHLAMYDIA PROBE AMP (~~LOC~~) NOT AT ARMC
Chlamydia: NEGATIVE
Comment: NEGATIVE
Comment: NORMAL
Neisseria Gonorrhea: NEGATIVE

## 2022-06-02 ENCOUNTER — Other Ambulatory Visit: Payer: Self-pay

## 2022-06-02 ENCOUNTER — Encounter (HOSPITAL_BASED_OUTPATIENT_CLINIC_OR_DEPARTMENT_OTHER): Payer: Self-pay

## 2022-06-02 ENCOUNTER — Emergency Department (HOSPITAL_BASED_OUTPATIENT_CLINIC_OR_DEPARTMENT_OTHER): Payer: Medicare Other

## 2022-06-02 ENCOUNTER — Emergency Department (HOSPITAL_BASED_OUTPATIENT_CLINIC_OR_DEPARTMENT_OTHER)
Admission: EM | Admit: 2022-06-02 | Discharge: 2022-06-02 | Disposition: A | Discharge status: Home / Self Care | Payer: Medicare Other | Attending: Emergency Medicine | Admitting: Emergency Medicine

## 2022-06-02 DIAGNOSIS — Z79899 Other long term (current) drug therapy: Secondary | ICD-10-CM | POA: Diagnosis not present

## 2022-06-02 DIAGNOSIS — R519 Headache, unspecified: Secondary | ICD-10-CM | POA: Diagnosis present

## 2022-06-02 DIAGNOSIS — I10 Essential (primary) hypertension: Secondary | ICD-10-CM | POA: Diagnosis not present

## 2022-06-02 DIAGNOSIS — J0100 Acute maxillary sinusitis, unspecified: Secondary | ICD-10-CM | POA: Diagnosis not present

## 2022-06-02 MED ORDER — AMOXICILLIN-POT CLAVULANATE 875-125 MG PO TABS: 1.0000 | ORAL_TABLET | Freq: Two times a day (BID) | ORAL | 0 refills | Status: DC

## 2022-06-02 MED ORDER — OXYCODONE-ACETAMINOPHEN 5-325 MG PO TABS
1.0000 | ORAL_TABLET | Freq: Once | ORAL | Status: AC
  Administered 2022-06-02: 1 via ORAL
  Filled 2022-06-02: qty 1

## 2022-06-02 NOTE — Discharge Instructions (Addendum)
Please take your antibiotics as prescribed. Take tylenol/ibuprofen up to 3 times daily for pain. I recommend close follow-up with PCP for reevaluation.  Please do not hesitate to return to emergency department if worrisome signs symptoms we discussed become apparent.

## 2022-06-02 NOTE — ED Provider Notes (Signed)
[facility] Provider Note   CSN: [id number] Arrival date & time: 06/02/22  1215     History  Chief Complaint  Patient presents with   Headache    Hector York is a 62 y.o. male with past medical history of hypertension, thyroid disease presents today for evaluation of headache and right-sided facial pain.  States he started to have pain in his forehead about 7 days.  Pain radiates to his right eye and right side of his face.  He states he fell like it was toothache but he has lost all his teeth.  Denies any difficulty with swallowing, speaking, drinking.  States he has not tried any medication at home for pain.  Denies fever, chest pain, shortness of breath, extremity weakness, numbness.   Headache     Past Medical History:  Diagnosis Date   Hypertension    Renal disorder    unknown problem, followed by Dr. at [facility]   Thyroid disease    Past Surgical History:  Procedure Laterality Date   HERNIA REPAIR       Home Medications Prior to Admission medications   Medication Sig Start Date End Date Taking? Authorizing Provider  amlodipine-atorvastatin (CADUET) 10-10 MG tablet Take 1 tablet by mouth daily.    [provider]  aspirin 81 MG chewable tablet Chew by mouth daily.    [provider]  calcitRIOL (ROCALTROL) 0.25 MCG capsule Take 0.25 mcg by mouth daily.    [provider]  carvedilol (COREG) 3.125 MG tablet Take 3.125 mg by mouth 2 (two) times daily with a meal.    [provider]  cyclobenzaprine (FLEXERIL) 10 MG tablet Take 1 tablet (10 mg total) by mouth 3 (three) times daily as needed for muscle spasms. 04/25/20   Pricilla Loveless, MD  hydrocortisone cream 1 % Apply to affected area 2 times daily 07/06/21   Teressa Lower, PA-C  LEVOTHYROXINE SODIUM PO Take by mouth.    [provider]  metroNIDAZOLE (FLAGYL) 500 MG tablet Take 1 tablet (500 mg total) by mouth 2 (two)  times daily. 07/06/21   Honor Loh M, PA-C  predniSONE (DELTASONE) 20 MG tablet 3 tabs po daily x 3 days, then 2 tabs x 3 days, then 1.5 tabs x 3 days, then 1 tab x 3 days, then 0.5 tabs x 3 days 04/25/20   Pricilla Loveless, MD  SIMVASTATIN PO Take by mouth.    [provider]  LISINOPRIL PO Take by mouth.  04/25/20  [provider]      Allergies    Lisinopril and Other    Review of Systems   Review of Systems  Neurological:  Positive for headaches.    Physical Exam Updated Vital Signs BP (!) 175/81   Pulse (!) 42   Temp 98.8 F (37.1 C) (Oral)   Resp 15   Ht 5\' 9"  (1.753 m)   Wt 74.4 kg   SpO2 100%   BMI 24.22 kg/m  Physical Exam Vitals and nursing note reviewed.  Constitutional:      Appearance: Normal appearance.  HENT:     Head: Normocephalic and atraumatic.     Mouth/Throat:     Mouth: Mucous membranes are moist.  Eyes:     General: No scleral icterus. Cardiovascular:     Rate and Rhythm: Normal rate and regular rhythm.     Pulses: Normal pulses.     Heart sounds: Normal heart sounds.  Pulmonary:     Effort: Pulmonary effort is normal.     Breath sounds: Normal breath sounds.  Abdominal:     General: Abdomen is flat.     Palpations: Abdomen is soft.     Tenderness: There is no abdominal tenderness.  Musculoskeletal:        General: No deformity.  Skin:    General: Skin is warm.     Findings: No rash.  Neurological:     General: No focal deficit present.     Mental Status: He is alert.     Comments: Cranial nerves II through XII intact. Intact sensation to light touch in all 4 extremities. 5/5 strength in all 4 extremities. Intact finger-to-nose and heel-to-shin of all 4 extremities. No visual field cuts. No neglect noted. No aphasia noted.   Psychiatric:        Mood and Affect: Mood normal.     ED Results / Procedures / Treatments   Labs (all labs ordered are listed, but only abnormal results are displayed) Labs Reviewed - No  data to display  EKG None  Radiology CT Head Wo Contrast  Result Date: 06/02/2022 CLINICAL DATA:  Headache EXAM: CT HEAD WITHOUT CONTRAST TECHNIQUE: Contiguous axial images were obtained from the base of the skull through the vertex without intravenous contrast. RADIATION DOSE REDUCTION: This exam was performed according to the departmental dose-optimization program which includes automated exposure control, adjustment of the mA and/or kV according to patient size and/or use of iterative reconstruction technique. COMPARISON:  None Available. FINDINGS: Brain: No evidence of acute infarction, hemorrhage, hydrocephalus, extra-axial collection or mass lesion/mass effect. Sequela of mild chronic microvascular ischemic change. Cavum septum pellucidum et vergae. Vascular: No hyperdense vessel or unexpected calcification. Skull: Normal. Negative for fracture or focal lesion. Sinuses/Orbits: No middle ear or mastoid. Air-fluid level in the right maxillary sinus and near-complete opacification of the left maxillary sinus. Mucosal thickening is also present in the bilateral ethmoid and sphenoid sinuses. Orbits are unremarkable. Other: None. IMPRESSION: 1. No acute intracranial abnormality. 2. Air-fluid level in the right maxillary sinus. Findings can be seen in the setting sinusitis. Electronically Signed   By: Lorenza Cambridge M.D.   On: 06/02/2022 15:36    Procedures Procedures    Medications Ordered in ED Medications  oxyCODONE-acetaminophen (PERCOCET/ROXICET) 5-325 MG per tablet 1 tablet (1 tablet Oral Given 06/02/22 1536)    ED Course/ Medical Decision Making/ A&P                             Medical Decision Making Amount and/or Complexity of Data Reviewed Radiology: ordered.  Risk Prescription drug management.   This patient presents to the ED for headache, right-sided facial pain, this involves an extensive number of treatment options, and is a complaint that carries with a high risk of  complications and morbidity.  The differential diagnosis includes ICH, fracture, tension type headache, migraines, preseptal cellulitis, abscess, sinusitis, acute otitis media, acute otitis externa, infectious etiology.  This is not an exhaustive list.  Imaging studies: I ordered imaging studies. I personally reviewed, interpreted imaging and agree with the radiologist's interpretations. The results include: CT head showed no acute intracranial abnormalities, it showed air-fluid level in the right maxillary sinus which can be seen in the setting of sinusitis.  Problem list/ ED course/ Critical interventions/ Medical management: HPI: See above Vital signs within normal range and stable throughout visit. Laboratory/imaging studies significant for: See above.  On physical examination, patient is afebrile and appears in no acute distress.  Neuroexam without any focal neurological deficits.  Pain is mostly behind the right eye and R maxillary sinus.  Low suspicion for ICH given benign neuroexam, no history of trauma, patient is not on any blood thinners.  History and examination consistent with tension type headache.  Low suspicion for preseptal cellulitis given no overlying skin erythema. CT scan showed no acute intracranial abnormalities, it did show air-fluid level in the right maxillary sinus which can be seen in setting of sinusitis.  I will treat him with Augmentin as symptom has been going on for 7 days.  Given oxycodone here for pain.  Reevaluation of patient after this medication showed that the patient improved.  Advised patient to take his antibiotics as described, take Tylenol ibuprofen for pain, follow-up with primary care for further evaluation management.  Strict return precaution discussed. I have reviewed the patient home medicines and have made adjustments as needed.  Cardiac monitoring/EKG: The patient was maintained on a cardiac monitor.  I personally reviewed and interpreted the cardiac  monitor which showed an underlying rhythm of: sinus rhythm.  Additional history obtained: External records from outside source obtained and reviewed including: Chart review including previous notes, labs, imaging.  Consultations obtained:  Disposition Continued outpatient therapy. Follow-up with PCP recommended for reevaluation of symptoms. Treatment plan discussed with patient.  Pt acknowledged understanding was agreeable to the plan. Worrisome signs and symptoms were discussed with patient, and patient acknowledged understanding to return to the ED if they noticed these signs and symptoms. Patient was stable upon discharge.   This chart was dictated using voice recognition software.  Despite best efforts to proofread,  errors can occur which can change the documentation meaning.          Final Clinical Impression(s) / ED Diagnoses Final diagnoses:  Acute non-recurrent maxillary sinusitis    Rx / DC Orders ED Discharge Orders          Ordered    amoxicillin-clavulanate (AUGMENTIN) 875-125 MG tablet  Every 12 hours        06/02/22 1553              Jeanelle Malling, [place] 06/02/22 1602    Tegeler, Canary Brim, MD 06/02/22 [phone]

## 2022-06-02 NOTE — ED Triage Notes (Addendum)
C/o headache radiating to right side of face, states feels like he has a toothache but has no teeth. Wakes up in morning with nausea. States hasn't taken any otc meds.   Adds he has been out of antihypertensive x 1 week.

## 2022-06-02 NOTE — ED Notes (Signed)
D/c paperwork reviewed with pt, including prescriptions.  No questions or concerns voiced at time of d/c. . Pt verbalized understanding, Ambulatory without assistance to ED exit, NAD.   

## 2023-10-31 ENCOUNTER — Emergency Department (HOSPITAL_BASED_OUTPATIENT_CLINIC_OR_DEPARTMENT_OTHER)
Admission: EM | Admit: 2023-10-31 | Discharge: 2023-10-31 | Disposition: A | Discharge status: Home / Self Care | Attending: Emergency Medicine | Admitting: Emergency Medicine

## 2023-10-31 ENCOUNTER — Encounter (HOSPITAL_BASED_OUTPATIENT_CLINIC_OR_DEPARTMENT_OTHER): Payer: Self-pay | Admitting: Emergency Medicine

## 2023-10-31 ENCOUNTER — Other Ambulatory Visit: Payer: Self-pay

## 2023-10-31 DIAGNOSIS — N189 Chronic kidney disease, unspecified: Secondary | ICD-10-CM | POA: Diagnosis not present

## 2023-10-31 DIAGNOSIS — Z7982 Long term (current) use of aspirin: Secondary | ICD-10-CM | POA: Insufficient documentation

## 2023-10-31 DIAGNOSIS — M10371 Gout due to renal impairment, right ankle and foot: Secondary | ICD-10-CM | POA: Insufficient documentation

## 2023-10-31 DIAGNOSIS — M79672 Pain in left foot: Secondary | ICD-10-CM | POA: Diagnosis present

## 2023-10-31 DIAGNOSIS — M10372 Gout due to renal impairment, left ankle and foot: Secondary | ICD-10-CM

## 2023-10-31 HISTORY — DX: Gout, unspecified: M10.9

## 2023-10-31 MED ORDER — PREDNISONE 10 MG (21) PO TBPK: ORAL_TABLET | ORAL | 0 refills | Status: AC

## 2023-10-31 MED ORDER — PREDNISONE 50 MG PO TABS
60.0000 mg | ORAL_TABLET | Freq: Once | ORAL | Status: AC
  Administered 2023-10-31: 60 mg via ORAL
  Filled 2023-10-31: qty 1

## 2023-10-31 MED ORDER — HYDROCODONE-ACETAMINOPHEN 5-325 MG PO TABS: 1.0000 | ORAL_TABLET | Freq: Four times a day (QID) | ORAL | 0 refills | Status: AC | PRN

## 2023-10-31 NOTE — ED Provider Notes (Signed)
 [place]  Provider Note  CSN: [id number] Arrival date & time: 10/31/23 9662  History Chief Complaint  Patient presents with   Foot Pain    Hector York is a 63 y.o. male with history of CKD, gout reports 4 days of worsening pain and swelling in L foot/ankle, similar to previous gout flares. No falls or injuries. No fever.    Home Medications Prior to Admission medications   Medication Sig Start Date End Date Taking? Authorizing Provider  HYDROcodone-acetaminophen  (NORCO/VICODIN) 5-325 MG tablet Take 1 tablet by mouth every 6 (six) hours as needed for severe pain (pain score 7-10). 10/31/23  Yes Roselyn Carlin NOVAK, MD  predniSONE  (STERAPRED UNI-PAK 21 TAB) 10 MG (21) TBPK tablet 10mg  Tabs, 6 day taper. Use as directed 10/31/23  Yes Roselyn Carlin NOVAK, MD  amlodipine-atorvastatin (CADUET) 10-10 MG tablet Take 1 tablet by mouth daily.    [provider]  aspirin 81 MG chewable tablet Chew by mouth daily.    [provider]  calcitRIOL (ROCALTROL) 0.25 MCG capsule Take 0.25 mcg by mouth daily.    [provider]  carvedilol (COREG) 3.125 MG tablet Take 3.125 mg by mouth 2 (two) times daily with a meal.    [provider]  hydrocortisone  cream 1 % Apply to affected area 2 times daily 07/06/21   Theotis Peers M, PA-C  LEVOTHYROXINE SODIUM PO Take by mouth.    [provider]  SIMVASTATIN PO Take by mouth.    [provider]  LISINOPRIL PO Take by mouth.  04/25/20  [provider]     Allergies    Lisinopril and Other   Review of Systems   Review of Systems Please see HPI for pertinent positives and negatives  Physical Exam BP (!) 195/87 (BP Location: Left Wrist)   Pulse (!) 55   Temp 98.6 F (37 C) (Oral)   Resp 20   Ht 5' 9 (1.753 m)   Wt 77.1 kg   SpO2 99%   BMI 25.10 kg/m   Physical Exam Vitals and nursing note reviewed.  HENT:     Head: Normocephalic.      Nose: Nose normal.  Eyes:     Extraocular Movements: Extraocular movements intact.  Pulmonary:     Effort: Pulmonary effort is normal.  Musculoskeletal:        General: Swelling and tenderness present. No deformity. Normal range of motion.     Cervical back: Neck supple.  Skin:    Findings: No rash (on exposed skin).  Neurological:     Mental Status: He is alert and oriented to person, place, and time.  Psychiatric:        Mood and Affect: Mood normal.     ED Results / Procedures / Treatments   EKG None  Procedures Procedures  Medications Ordered in the ED Medications  predniSONE  (DELTASONE ) tablet 60 mg (has no administration in time range)    Initial Impression and Plan  Patient here with symptoms consistent with gout flare. Given CKD, will avoid NSAIDs, plan Rx for prednisone , norco and PCP follow up. RTED for any other concerns.   ED Course       MDM Rules/Calculators/A&P Medical Decision Making Problems Addressed: Acute gout due to renal impairment involving left foot: acute illness or injury  Risk Prescription drug management.     Final Clinical Impression(s) / ED Diagnoses Final diagnoses:  Acute gout due to renal impairment involving left  foot    Rx / DC Orders ED Discharge Orders          Ordered    predniSONE  (STERAPRED UNI-PAK 21 TAB) 10 MG (21) TBPK tablet        10/31/23 0402    HYDROcodone-acetaminophen  (NORCO/VICODIN) 5-325 MG tablet  Every 6 hours PRN        10/31/23 0402             Roselyn Carlin NOVAK, MD 10/31/23 [phone]

## 2023-10-31 NOTE — ED Triage Notes (Signed)
 Left foot pain and swelling, X 4 days, Hx of gout.
